# Patient Record
Sex: Male | Born: 1960
Health system: Southern US, Community
[De-identification: ages and names within clinical notes are randomized; demographics above are authoritative.]

## PROBLEM LIST (undated history)

## (undated) DIAGNOSIS — E079 Disorder of thyroid, unspecified: Secondary | ICD-10-CM

## (undated) HISTORY — DX: Disorder of thyroid, unspecified: E07.9

## (undated) HISTORY — PX: COLONOSCOPY: SHX174

---

## 1988-01-20 HISTORY — PX: MENISCUS REPAIR: SHX5179

## 1998-01-19 HISTORY — PX: ARTHROSCOPIC REPAIR ACL: SUR80

## 1998-01-19 HISTORY — PX: WRIST FUSION: SHX839

## 2009-06-25 ENCOUNTER — Emergency Department: Payer: Self-pay | Admitting: Emergency Medicine

## 2010-06-02 ENCOUNTER — Emergency Department: Payer: Self-pay | Admitting: Internal Medicine

## 2010-06-20 ENCOUNTER — Encounter: Payer: Self-pay | Admitting: Family Medicine

## 2011-11-05 ENCOUNTER — Emergency Department: Payer: Self-pay | Admitting: Emergency Medicine

## 2011-11-20 ENCOUNTER — Ambulatory Visit: Payer: Self-pay | Admitting: Family Medicine

## 2011-12-10 ENCOUNTER — Ambulatory Visit: Payer: Self-pay | Admitting: Otolaryngology

## 2012-01-20 HISTORY — PX: THUMB ARTHROSCOPY: SHX2509

## 2013-01-19 HISTORY — PX: FINGER AMPUTATION: SHX636

## 2013-06-03 ENCOUNTER — Emergency Department: Payer: Self-pay | Admitting: Emergency Medicine

## 2013-06-03 LAB — CBC WITH DIFFERENTIAL/PLATELET
BASOS ABS: 0.1 10*3/uL (ref 0.0–0.1)
Basophil %: 0.8 %
EOS PCT: 4.4 %
Eosinophil #: 0.3 10*3/uL (ref 0.0–0.7)
HCT: 44.2 % (ref 40.0–52.0)
HGB: 14.6 g/dL (ref 13.0–18.0)
LYMPHS PCT: 31.4 %
Lymphocyte #: 2.4 10*3/uL (ref 1.0–3.6)
MCH: 27.1 pg (ref 26.0–34.0)
MCHC: 33 g/dL (ref 32.0–36.0)
MCV: 82 fL (ref 80–100)
MONO ABS: 0.7 x10 3/mm (ref 0.2–1.0)
Monocyte %: 9.2 %
Neutrophil #: 4.1 10*3/uL (ref 1.4–6.5)
Neutrophil %: 54.2 %
PLATELETS: 223 10*3/uL (ref 150–440)
RBC: 5.39 10*6/uL (ref 4.40–5.90)
RDW: 14.5 % (ref 11.5–14.5)
WBC: 7.6 10*3/uL (ref 3.8–10.6)

## 2013-06-03 LAB — COMPREHENSIVE METABOLIC PANEL
ALT: 35 U/L (ref 12–78)
Albumin: 3.9 g/dL (ref 3.4–5.0)
Alkaline Phosphatase: 82 U/L
Anion Gap: 5 — ABNORMAL LOW (ref 7–16)
BILIRUBIN TOTAL: 0.6 mg/dL (ref 0.2–1.0)
BUN: 19 mg/dL — ABNORMAL HIGH (ref 7–18)
CREATININE: 1.5 mg/dL — AB (ref 0.60–1.30)
Calcium, Total: 8.8 mg/dL (ref 8.5–10.1)
Chloride: 108 mmol/L — ABNORMAL HIGH (ref 98–107)
Co2: 27 mmol/L (ref 21–32)
EGFR (Non-African Amer.): 52 — ABNORMAL LOW
Glucose: 101 mg/dL — ABNORMAL HIGH (ref 65–99)
Osmolality: 282 (ref 275–301)
POTASSIUM: 3.9 mmol/L (ref 3.5–5.1)
SGOT(AST): 40 U/L — ABNORMAL HIGH (ref 15–37)
Sodium: 140 mmol/L (ref 136–145)
Total Protein: 8.1 g/dL (ref 6.4–8.2)

## 2013-06-06 ENCOUNTER — Ambulatory Visit: Payer: Self-pay | Admitting: Orthopedic Surgery

## 2013-07-16 IMAGING — US ULTRASOUND CORE BIOPSY
1 series · 14 of 25 positions shown · non-contrast
Comparison: none

REASON FOR EXAM: rt thyroid nodule
COMMENTS:

[Series 1: ultrasound core biopsy · 0.08mm/px · 14 of 40 slices shown]
[im 1/40]
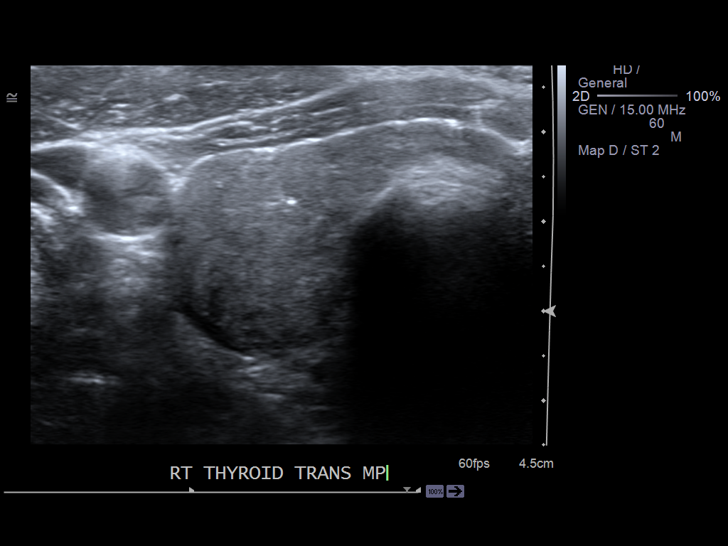
[im 4/40]
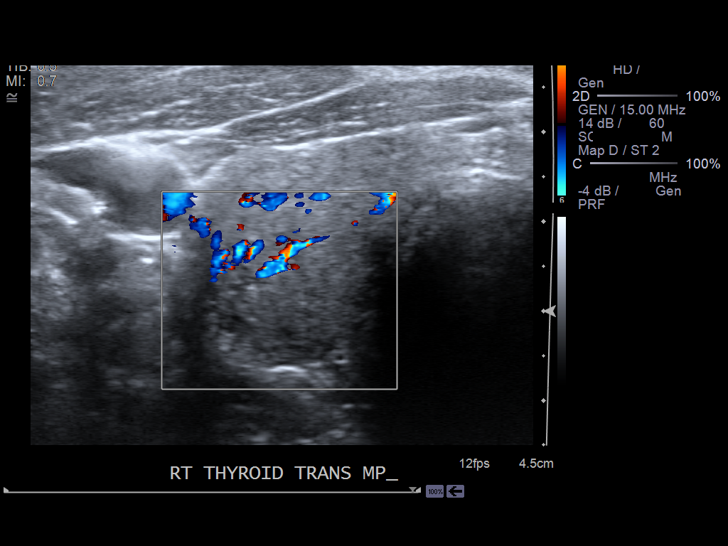
[im 7/40]
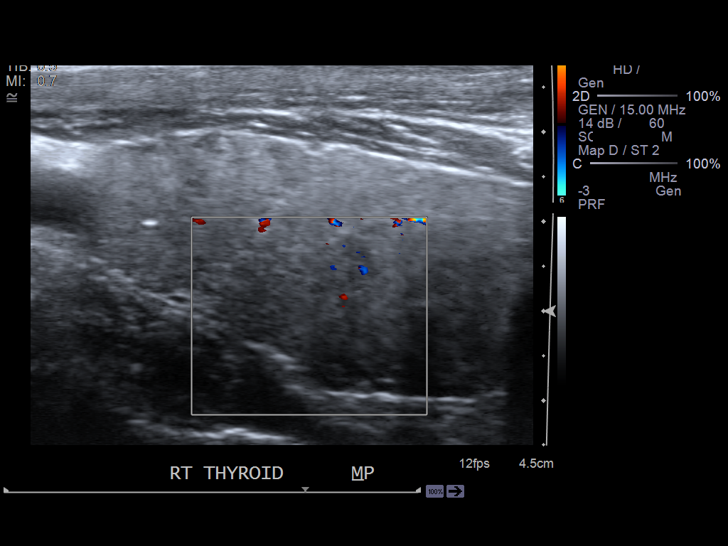
[im 10/40]
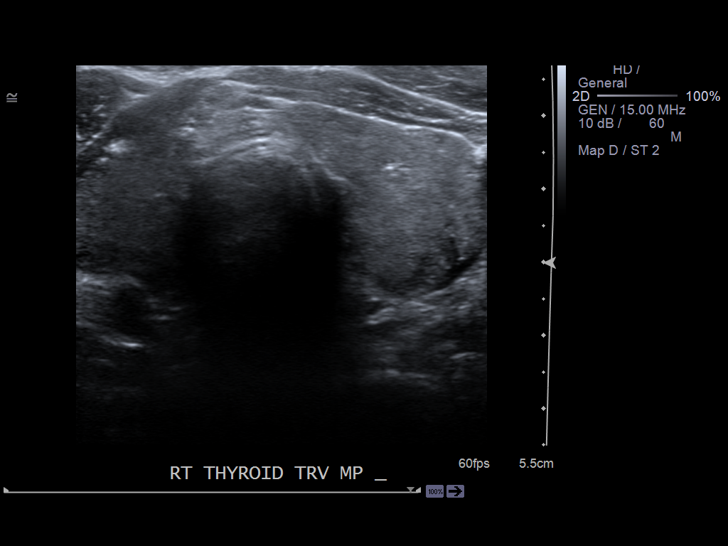
[im 14/40]
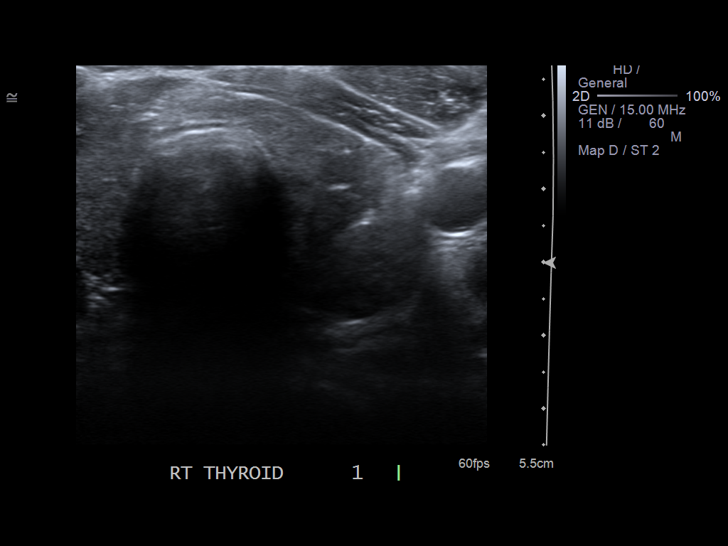
[im 15/40]
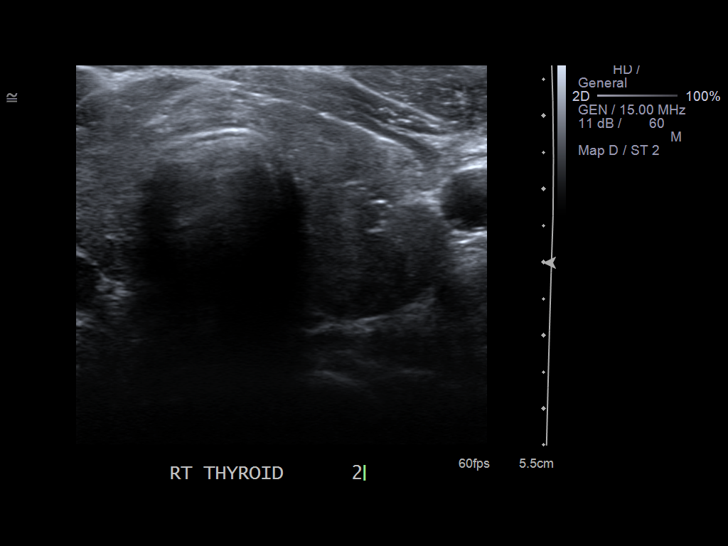
[im 18/40]
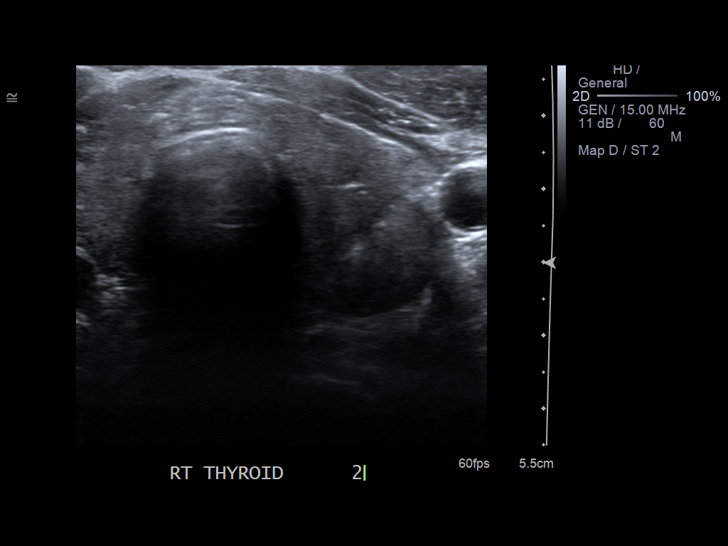
[im 22/40]
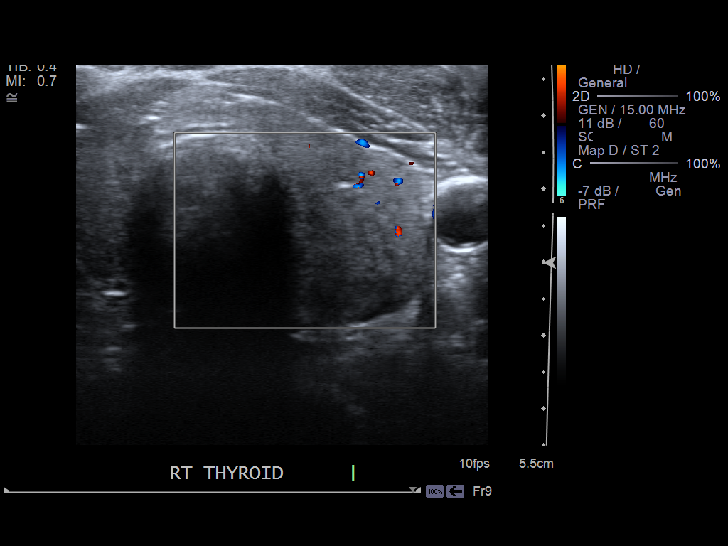
[im 25/40]
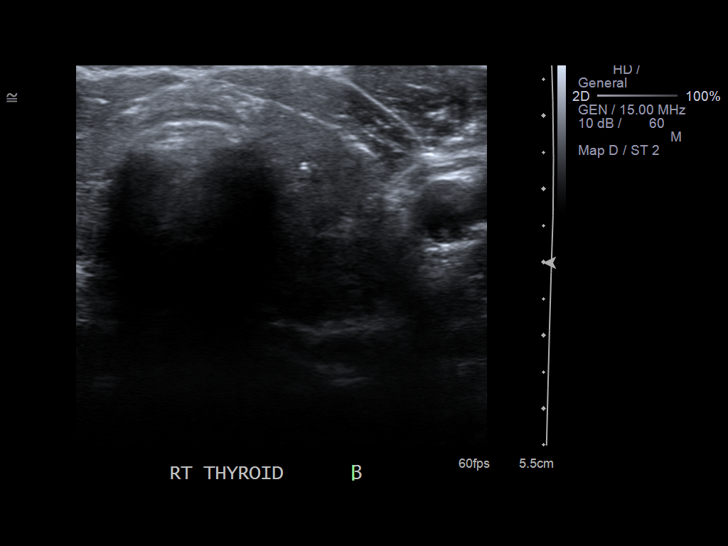
[im 27/40]
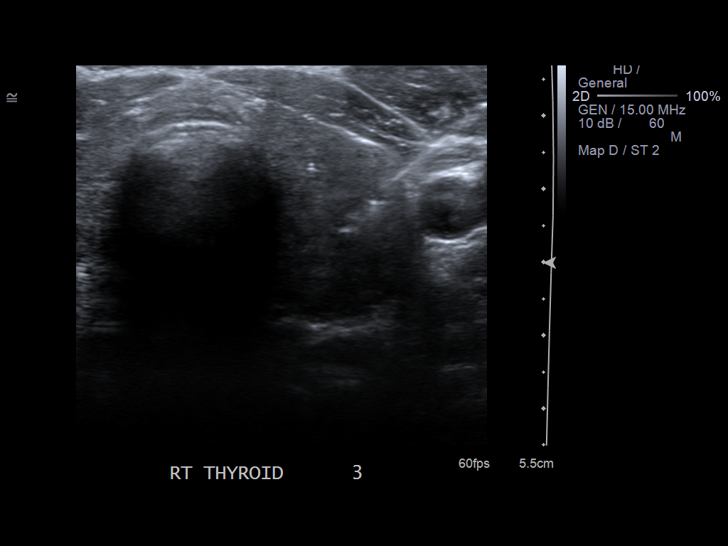
[im 30/40]
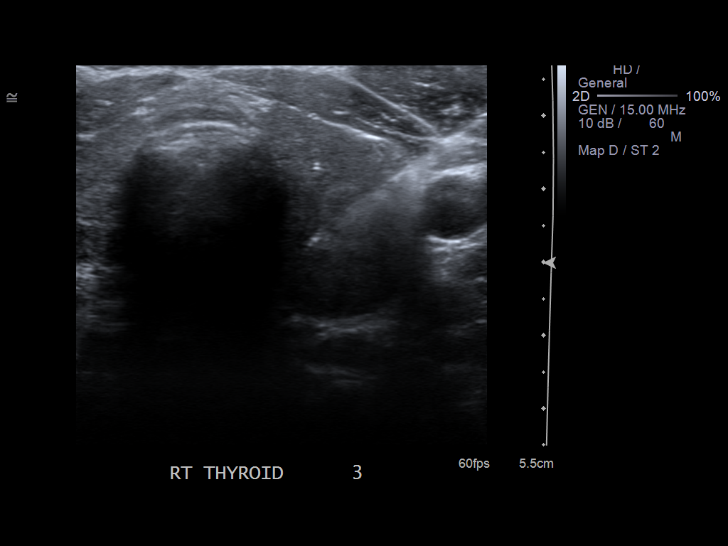
[im 33/40]
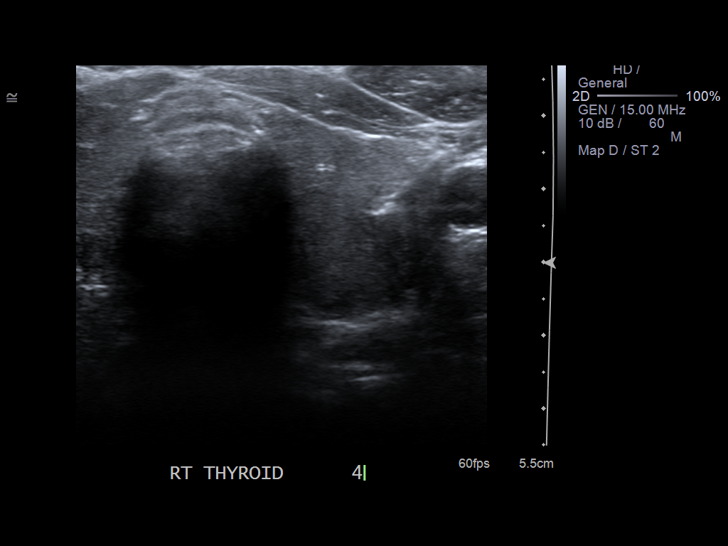
[im 36/40]
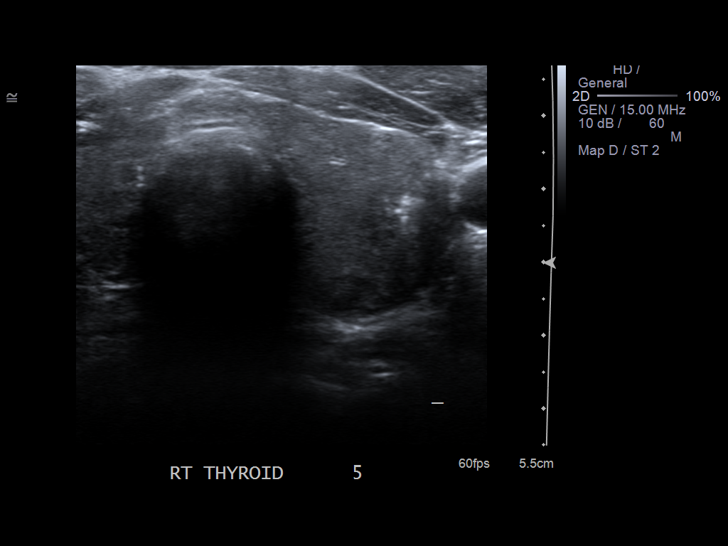
[im 40/40]
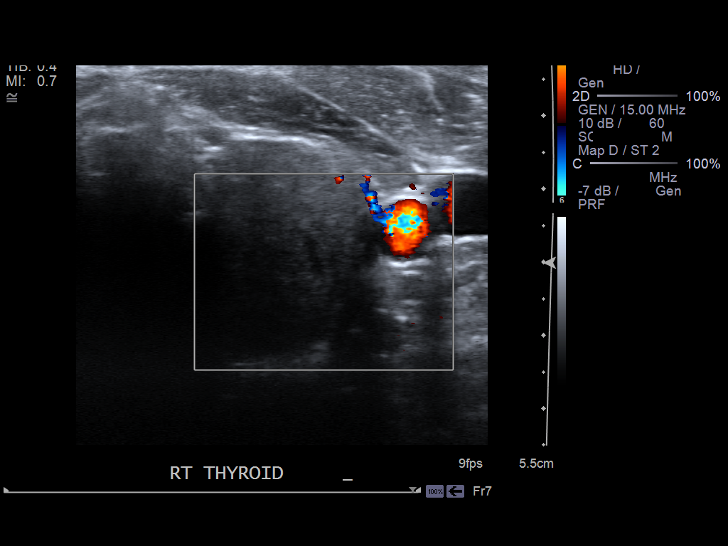

[14 of 25 positions shown; findings below may reference images not displayed]

PROCEDURE:     US  - US GUIDED BX/ASPIRATION NOT BR  - December 10, 2011  [DATE]

RESULT:

Procedure: The patient was informed of the risks and benefits of the
procedure and proper informed consent was obtained. The patient was brought
to the Ultrasound Suite and placed in a supine position. The thyroid was
evaluated and a proper entry site for a nodular mass within the posterior
aspect of the right lobe of the thyroid was established. The overlying soft
tissues were then prepped and draped in the usual sterile fashion. Utilizing
6 mL of 1% lidocaine without epinephrine the overlying soft tissues were
anesthetized. Five passes were made into the thyroid nodule with a 23-gauge
heparinized needle. Seldinger technique was used to remove samples. The
patient tolerated the procedure without complications. Preliminary
evaluation demonstrated appropriate specimens.

Status post biopsy there is no evidence of perilesional free fluid or active
hemorrhage.
IMPRESSION: Ultrasound-guided thyroid biopsy as described above.

## 2013-09-07 ENCOUNTER — Ambulatory Visit: Payer: Self-pay | Admitting: Orthopedic Surgery

## 2013-10-18 ENCOUNTER — Encounter: Payer: Self-pay | Admitting: Orthopedic Surgery

## 2013-10-19 ENCOUNTER — Encounter: Payer: Self-pay | Admitting: Orthopedic Surgery

## 2013-11-19 ENCOUNTER — Encounter: Payer: Self-pay | Admitting: Orthopedic Surgery

## 2013-12-16 ENCOUNTER — Encounter (HOSPITAL_BASED_OUTPATIENT_CLINIC_OR_DEPARTMENT_OTHER): Payer: Self-pay | Admitting: *Deleted

## 2013-12-16 ENCOUNTER — Emergency Department (HOSPITAL_BASED_OUTPATIENT_CLINIC_OR_DEPARTMENT_OTHER)
Admission: EM | Admit: 2013-12-16 | Discharge: 2013-12-16 | Disposition: A | Discharge status: Home / Self Care | Payer: Self-pay | Attending: Emergency Medicine | Admitting: Emergency Medicine

## 2013-12-16 DIAGNOSIS — J020 Streptococcal pharyngitis: Secondary | ICD-10-CM | POA: Insufficient documentation

## 2013-12-16 LAB — RAPID STREP SCREEN (MED CTR MEBANE ONLY): STREPTOCOCCUS, GROUP A SCREEN (DIRECT): POSITIVE — AB

## 2013-12-16 MED ORDER — PENICILLIN G BENZATHINE 1200000 UNIT/2ML IM SUSP
1.2000 10*6.[IU] | Freq: Once | INTRAMUSCULAR | Status: AC
  Administered 2013-12-16: 1.2 10*6.[IU] via INTRAMUSCULAR
  Filled 2013-12-16: qty 2

## 2013-12-16 NOTE — ED Provider Notes (Signed)
CSN: [id number]     Arrival date & time 12/16/13  1309 History   First MD Initiated Contact with Patient 12/16/13 1614     Chief Complaint  Patient presents with  . Fever   (Consider location/radiation/quality/duration/timing/severity/associated sxs/prior Treatment) HPI  Carlos Mccarty. is a 53 yo male presenting with chills, fever of 102, and sore throat that started today.  He states he woke up this am and felt generally achy and his throat hurt.  He has taken ibuprofen for the temperature which has also helped his throat.  He denies cough, rash, nasal congestion, nausea, vomiting or abd pain    History reviewed. No pertinent past medical history. Past Surgical History  Procedure Laterality Date  . Finger amputation     No family history on file. History  Substance Use Topics  . Smoking status: Never Smoker   . Smokeless tobacco: Not on file  . Alcohol Use: No    Review of Systems  Constitutional: Positive for fever and chills.  HENT: Positive for sore throat.   Eyes: Negative for visual disturbance.  Respiratory: Negative for shortness of breath.   Cardiovascular: Negative for chest pain.  Gastrointestinal: Negative for nausea and vomiting.  Musculoskeletal: Negative for myalgias.  Skin: Negative for rash.  Neurological: Negative for headaches.   Allergies  Review of patient's allergies indicates no known allergies.  Home Medications   Prior to Admission medications   Not on File   BP 129/70 mmHg  Pulse 85  Temp(Src) 99 F (37.2 C) (Oral)  Resp 16  Ht 6\' 5"  (1.956 m)  Wt 275 lb (124.739 kg)  BMI 32.60 kg/m2  SpO2 98% Physical Exam  Constitutional: He appears well-developed and well-nourished. No distress.  HENT:  Head: Normocephalic and atraumatic.  Mouth/Throat: Uvula is midline. No trismus in the jaw. No uvula swelling. Posterior oropharyngeal erythema present. No oropharyngeal exudate, posterior oropharyngeal edema or tonsillar abscesses.  Eyes:  Conjunctivae are normal.  Neck: Neck supple. No thyromegaly present.  Cardiovascular: Normal rate, regular rhythm and intact distal pulses.   Pulmonary/Chest: Effort normal and breath sounds normal. No respiratory distress. He has no wheezes. He has no rales. He exhibits no tenderness.  Abdominal: Soft. There is no tenderness.  Musculoskeletal: He exhibits no tenderness.  Lymphadenopathy:    He has cervical adenopathy.  Neurological: He is alert.  Skin: Skin is warm and dry. No rash noted. He is not diaphoretic.  Psychiatric: He has a normal mood and affect.  Nursing note and vitals reviewed.   ED Course  Procedures (including critical care time) Labs Review Labs Reviewed  RAPID STREP SCREEN - Abnormal; Notable for the following:    Streptococcus, Group A Screen (Direct) POSITIVE (*)    All other components within normal limits    Imaging Review No results found.   EKG Interpretation None      MDM   Final diagnoses:  Strep pharyngitis   53 yo male with fever, cervical lymphadenopathy, & sore throat.Positive strep screen. Treated in the ED with PCN IM.  Discussed importance of water rehydration. Presentation non concerning for PTA or infxn spread to soft tissue. No trismus or uvula deviation. Specific return precautions discussed. Pt able to drink water in ED without difficulty with intact air way. Pt is well-appearing, in no acute distress and vital signs are stable.  They appear safe to be discharged.  Discharge include follow-up with their PCP.  Return precautions provided.  Pt aware of plan and in agreement.  Filed Vitals:   12/16/13 1327 12/16/13 1605  BP: 118/74 129/70  Pulse: 51 85  Temp: 100 F (37.8 C) 99 F (37.2 C)  TempSrc: Oral Oral  Resp: 16 16  Height: 6\' 5"  (1.956 m)   Weight: 275 lb (124.739 kg)   SpO2: 99% 98%   Meds given in ED:  Medications  penicillin g benzathine (BICILLIN LA) 1200000 UNIT/2ML injection 1.2 Million Units (1.2 Million Units  Intramuscular Given 12/16/13 1725)    There are no discharge medications for this patient.     Britt Bottom, NP 12/19/13 [place], MD 12/19/13 [phone]

## 2013-12-16 NOTE — Discharge Instructions (Signed)
Please follow the directions provided.  Be sure to establish care with a primary care provider to follow-up to ensure you are getting better.  The antibiotic shot you were given is all you will need to treat this infection.  You may use tylenol or ibuprofen for continued fever or muscle aches.  Don't hesitate to return for new, worsening or concerning symptoms.     SEEK IMMEDIATE MEDICAL CARE IF:  You develop any new symptoms such as vomiting, severe headache, stiff or painful neck, chest pain, shortness of breath, or trouble swallowing.  You develop severe throat pain, drooling, or changes in your voice.  You develop swelling of the neck, or the skin on the neck becomes red and tender.  You develop signs of dehydration, such as fatigue, dry mouth, and decreased urination.  You become increasingly sleepy, or you cannot wake up completely.     Emergency Department Resource Guide 1) Find a Doctor and Pay Out of Pocket Although you won't have to find out who is covered by your insurance plan, it is a good idea to ask around and get recommendations. You will then need to call the office and see if the doctor you have chosen will accept you as a new patient and what types of options they offer for patients who are self-pay. Some doctors offer discounts or will set up payment plans for their patients who do not have insurance, but you will need to ask so you aren't surprised when you get to your appointment.  2) Contact Your Local Health Department Not all health departments have doctors that can see patients for sick visits, but many do, so it is worth a call to see if yours does. If you don't know where your local health department is, you can check in your phone book. The CDC also has a tool to help you locate your state's health department, and many state websites also have listings of all of their local health departments.  3) Find a [facility] If your illness is not likely to be very severe  or complicated, you may want to try a walk in clinic. These are popping up all over the country in pharmacies, drugstores, and shopping centers. They're usually staffed by nurse practitioners or physician assistants that have been trained to treat common illnesses and complaints. They're usually fairly quick and inexpensive. However, if you have serious medical issues or chronic medical problems, these are probably not your best option.  No Primary Care Doctor: - Call Health Connect at  [phone] - they can help you locate a primary care doctor that  accepts your insurance, provides certain services, etc. - Physician Referral Service- [phone]  Chronic Pain Problems: Organization         Address  Phone   Notes  [facility]  [phone] Patients need to be referred by their primary care doctor.   Medication Assistance: Organization         Address  Phone   Notes  [facility] [address] [place], [place] [address] [phone] --Must be a resident of [facility] -- Must have NO insurance coverage whatsoever (no Medicaid/ Medicare, etc.) -- The pt. MUST have a primary care doctor that directs their care regularly and follows them in the community   Med[facility]ist  [phone]   [employer]  [phone]    Agencies that provide inexpensive medical care: Organization  Address  Phone   Notes  [place]  [phone]   [facility]    [phone]   [facility] [place], [place] [address] [phone]   [place] [address] [place], [place] [phone]   Planned Parenthood    [phone]   [facility]    [phone]   [place] and [place] [address], [place] Phone:  [phone], Fax:  [phone] Hours of Operation:  9 am - 6 pm, M-F.  Also accepts  Medicaid/Medicare and self-pay.  [facility] for [place], [address], [place] Phone: [phone], Fax: [phone]. Hours of Operation:  8:30 am - 5:30 pm, M-F.  Also accepts Medicaid and self-pay.  [facility] [place] [address] [place] Phone: [phone]   [place], [place], [place] [phone], Ext. [phone] Mondays & Thursdays: 7-9 AM.  First 15 patients are seen on a first come, first serve basis.    [place] Providers:  Organization         Address  Phone   Notes  [facility] [address] [place] [address], [place] [phone] Also accepts self-pay patients.  [facility] [place] [address], [place]  [phone]   [place], [address], [place] [phone]   [facility] [address] [place] [phone]   Lucianne Lei [address], [place]   [phone] Only accepts [place] Access [place] patients after they have their name applied to their card.   Self-Pay (no insurance) in [facility]:  Organization         Address  Phone   Notes  Sickle Cell Patients, [facility] [place] [phone]   [facility] [place] [phone]   [facility] [place]  [place], [place],  [phone]   [facility]/Dr. Osei-Bonsu  [address] [place] or [address], [place] [phone] Phone number for both [place] and [place] locations is the same.  Urgent Medical and [facility] [address], [place] [phone]   [facility] [address] [place] or [address] [phone]   [facility] [address], [place] [phone], phone; [phone], fax Sees patients 1st and 3rd Saturday of every month.  Must not qualify for public or private insurance (i.e. Medicaid, Medicare, [place] Health Choice, Veterans' Benefits)  Household income should be no more than 200% of the poverty level The clinic cannot treat you if you are pregnant or think you are pregnant  Sexually transmitted diseases are not treated at the clinic.    Dental Care: Organization         Address  Phone  Notes  [facility] Department of [facility] [place] [phone] Accepts children up to age 83 who are enrolled in [place] or [place]; pregnant women with a Medicaid card; and children who have applied for Medicaid or [place] Health Choice, but were declined, whose parents can pay a reduced fee at time  of service.  [facility] Department of [employer] [facility]  [address], [place] [phone] Accepts children up to age 34 who are enrolled in [place] or [place]; pregnant women with a Medicaid card; and children who have applied for Medicaid or [place] Health Choice, but were declined, whose parents can pay a reduced fee at time of service.  [place] Adult Dental Access PROGRAM  [place] [phone] Patients are seen by appointment only. Walk-ins are not accepted. [place] will see patients 47 years of age and older. Monday - Tuesday (8am-5pm) Most Wednesdays (8:30-5pm) $30 per visit, cash only  [facility] Adult Dental Access PROGRAM  [address], [facility] [phone] Patients are seen by appointment only. Walk-ins are not accepted. [place] will see patients 77 years of age and older. One Wednesday Evening (Monthly: Volunteer Based).  $30 per visit, cash only  [place]  [phone] for adults; Children under age 30, call Graduate Pediatric Dentistry at [phone]. Children aged  3-14, please call [phone] to request a pediatric application.  Dental services are provided in all areas of dental care including fillings, crowns and bridges, complete and partial dentures, implants, gum treatment, root canals, and extractions. Preventive care is also provided. Treatment is provided to both adults and children. Patients are selected via a lottery and there is often a waiting list.   [facility] [address], [place]  [phone] [email or web address]   [facility] [address] [place], [place] [phone], Ext. [phone] Second and Fourth Thursday of each month, opens at 6:30 AM; Clinic ends at 9 AM.  Patients are seen on a first-come first-served basis, and a limited number are seen during each clinic.   [facility]  [address] Hillard Danker [place], [place] [phone]   Eligibility Requirements You must have lived in [place], [place], or [place] counties for at least the last three months.   You cannot be eligible for state or federal sponsored [employer], including [employer], [place], or [employer].   You generally cannot be eligible for healthcare insurance through your employer.    How to apply: Eligibility screenings are held every Tuesday and Wednesday afternoon from 1:00 pm until 4:00 pm. You do not need an appointment for the interview!  [facility] [address], [place], [place]   [place]  [place] Department  [place]  [phone]    Behavioral Health Resources in the Community: Intensive Outpatient Programs Organization         Address  Phone  Notes  [place]. [address], [place], [place] [phone]   [facility] Outpatient [address] [place], [place]   [facility]: [facility] [address]  [place], [place]   [place] [address]  [place], [place] or [phone]   Substance Abuse Resources Organization         Address  Phone  Notes  Al[facility] Drug Services  [phone]   [place]  [phone]   [facility] [place]   Chinita Pester  [phone]   Residential & Outpatient Substance Abuse Program  [phone]   Psychological Services Organization         Address  Phone  Notes  [facility] [place]  [place]  [phone]   [place] [address] [place] or [phone]    Mobile Crisis Teams Organization         Address  Phone  Notes  [facility], Mobile Crisis Care Unit  [phone]   [facility]  [address] [place], [place]   [place] Levels [address] [place] [phone]    Self-Help/Support Groups Organization         Address  Phone             Notes  [place]. of [place] - variety of support groups  [place] Call for more information  Narcotics Anonymous (NA), [facility] [address], [employer] [place]  2 meetings at this location   Special educational needs teacher         Address  Phone  Notes  [facility] [place],    [place]  [phone]   [facility]  [address], [place] [address], [place], [place]   [place], [place] [phone] Admissions: 8am-3pm M-F  Incentives Substance [place] [address],    [place], [place] [phone]   [facility] [address] [place], [place], [place]   [facility] [address],  [place], [place]   [facility] - Intensive Outpatient [address], [place], [place]   [facility]  ([place].) [address] [place].,  [place], [place] [phone] or [phone]   [facility] Services (RTS) [address] [place], [place] Accepts Medicaid  [facility] [place] [address]  [place] [phone] Substance Abuse/Addiction Treatment   [facility] Organization         Address  Phone  Notes  [facility]  [phone]   Domenic Schwab, PhD [address] [place], [place]   [phone] or [phone]   [place], [place] [phone]   [facility] [address] [place], [place] [phone] Insurance/Medicaid/sponsorship through [facility] and Families [address], [address] [place]                                    [place], [place] [phone] [place] [address], [place] [phone]    Dr. Adele Schilder  [phone]   [facility] [place] Dept. 1) [address] [place] 2) [place] 3)  [place] [address], [place] [phone]  [phone]   [place] [phone] or [phone] (After Hours)

## 2013-12-16 NOTE — ED Notes (Signed)
Patient states that he has a fever, sore throat

## 2013-12-19 ENCOUNTER — Encounter: Payer: Self-pay | Admitting: Orthopedic Surgery

## 2014-05-12 NOTE — Op Note (Signed)
PATIENT NAME:  Carlos Mccarty, Carlos Mccarty MR#:  [id number] DATE OF BIRTH:  1960-11-22  DATE OF PROCEDURE:  09/07/2013  PREOPERATIVE DIAGNOSIS: Residual left middle finger nail post amputation.  POSTOPERATIVE DIAGNOSIS:  Residual left middle finger nail post amputation.   PROCEDURE: Excision, nail and ablation of nail matrix.   ANESTHESIA: General.   SURGEON: Hessie Knows, M.D.   DESCRIPTION OF PROCEDURE: The patient was brought to the Operating Room and after adequate anesthesia was obtained, a digital block was also placed after prepping and draping, and appropriate patient identification and timeout procedure completed.  Sensorcaine 0.5% without epinephrine 10 mL was infiltrated on either side of the base of the middle finger to give postoperative analgesia.  The nail was quite thin.  Skin incisions were made on the dorsum of the finger directed to the sides to make a flap.  The bed of the nail was exposed. The nail, itself, was less than a millimeter thick. This was removed.  Needle tip cautery was used to ablate the tissue  of the matrix where the nail seemed to be arising from, trying to ablate all of this. The wound was then dressed with Xeroform, 4 x 4's and a 1 inch Kling. The patient tolerated the procedure well.   ESTIMATED BLOOD LOSS: Minimal.   COMPLICATIONS: None.   SPECIMEN: None.    ____________________________ Laurene Footman, MD mjm:TT D: 09/07/2013 17:57:12 ET T: 09/07/2013 18:32:05 ET JOB#: [id number]  cc: Laurene Footman, MD, <Dictator> Laurene Footman MD ELECTRONICALLY SIGNED 09/08/2013 7:15

## 2014-05-12 NOTE — Op Note (Signed)
PATIENT NAME:  Carlos Mccarty, Carlos Mccarty MR#:  [id number] DATE OF BIRTH:  25-Dec-1960  DATE OF PROCEDURE:  06/06/2013  PREOPERATIVE DIAGNOSIS: Open left middle fingertip amputation.   POSTOPERATIVE DIAGNOSIS:  Open left middle fingertip amputation.  PROCEDURE:  Revision amputation, left middle fingertip.   ANESTHESIA:  General.   SURGEON:  Hessie Knows, M.D.   DESCRIPTION OF PROCEDURE:  The patient was brought to the operating room and after adequate general anesthesia was obtained, the left arm was prepped and draped in the usual sterile fashion. A tourniquet was applied but not required at the upper arm. After patient identification and timeout procedures were completed, a digital block was placed for postoperative analgesia with 20 mL of 0.5% Sensorcaine without epinephrine. Next, a Soil scientist was used to elevate the soft tissue over the distal phalanx. A portion of the tuft had been amputated and several millimeters of bone were prominent. This bone was debrided with the use of a rongeur. There were loose skin edges which were devitalized. These were removed with the use of a scalpel. The germinal matrix was then exposed and phenyl applied, and then rinsed off with alcohol to try to prevent nail regrowth. The wound was then thoroughly irrigated. The volar and dorsal flaps covered, gave fairly good coverage over the bone, and were left in place. The wound was then dressed with Adaptic, 2 x 2's and a 2 inch Kling. The patient was then sent to the recovery room in stable condition. Penrose drain was used for about 3 minutes during the procedure to minimize bleeding, as the debridement was carried out. There was no specimen. No complications.    ESTIMATED BLOOD LOSS:   Minimal.   CONDITION:  To recovery room stable.    ____________________________ Laurene Footman, MD mjm:dmm D: 06/06/2013 22:00:40 ET T: 06/06/2013 22:20:54 ET JOB#: [id number]  cc: Laurene Footman, MD, <Dictator> Laurene Footman  MD ELECTRONICALLY SIGNED 06/07/2013 10:11

## 2015-04-09 ENCOUNTER — Encounter: Payer: Self-pay | Admitting: *Deleted

## 2015-04-09 ENCOUNTER — Other Ambulatory Visit: Payer: Self-pay

## 2015-04-10 ENCOUNTER — Encounter: Payer: Self-pay | Admitting: *Deleted

## 2015-04-10 NOTE — Patient Instructions (Signed)
  Your procedure is scheduled on: 04-16-15 Report to [place] To find out your arrival time please call [phone] between 1PM - 3PM on 04-15-15  Remember: Instructions that are not followed completely may result in serious medical risk, up to and including death, or upon the discretion of your surgeon and anesthesiologist your surgery may need to be rescheduled.    _X___ 1. Do not eat food or drink liquids after midnight. No gum chewing or hard candies.     _X___ 2. No Alcohol for 24 hours before or after surgery.   ____ 3. Bring all medications with you on the day of surgery if instructed.    ____ 4. Notify your doctor if there is any change in your medical condition     (cold, fever, infections).     Do not wear jewelry, make-up, hairpins, clips or nail polish.  Do not wear lotions, powders, or perfumes. You may wear deodorant.  Do not shave 48 hours prior to surgery. Men may shave face and neck.  Do not bring valuables to the hospital.    [facility] is not responsible for any belongings or valuables.               Contacts, dentures or bridgework may not be worn into surgery.  Leave your suitcase in the car. After surgery it may be brought to your room.  For patients admitted to the hospital, discharge time is determined by your treatment team.   Patients discharged the day of surgery will not be allowed to drive home.   Please read over the following fact sheets that you were given:      ____ Take these medicines the morning of surgery with A SIP OF WATER:    1. NONE  2.   3.   4.  5.  6.  ____ Fleet Enema (as directed)   ____ Use CHG Soap as directed  ____ Use inhalers on the day of surgery  ____ Stop metformin 2 days prior to surgery    ____ Take 1/2 of usual insulin dose the night before surgery and none on the morning of surgery.   ____ Stop Coumadin/Plavix/aspirin-N/A  ____ Stop Anti-inflammatories-NO NSAIDS OR ASA  PRODUCTS-TYLENOL OK TO TAKE   ____ Stop supplements until after surgery.    ____ Bring C-Pap to the hospital.

## 2015-04-15 MED ORDER — PHENYLEPHRINE HCL 10 % OP SOLN
OPHTHALMIC | Status: AC
  Filled 2015-04-15: qty 5

## 2015-04-15 MED ORDER — MOXIFLOXACIN HCL 0.5 % OP SOLN
OPHTHALMIC | Status: AC
  Filled 2015-04-15: qty 3

## 2015-04-15 MED ORDER — CYCLOPENTOLATE HCL 2 % OP SOLN
OPHTHALMIC | Status: AC
Start: 2015-04-15 — End: 2015-04-15
  Filled 2015-04-15: qty 2

## 2015-04-16 ENCOUNTER — Ambulatory Visit: Payer: Worker's Compensation | Admitting: Anesthesiology

## 2015-04-16 ENCOUNTER — Ambulatory Visit
Admission: RE | Admit: 2015-04-16 | Discharge: 2015-04-16 | Disposition: A | Discharge status: Home / Self Care | Payer: Worker's Compensation | Source: Ambulatory Visit | Attending: Orthopedic Surgery | Admitting: Orthopedic Surgery

## 2015-04-16 ENCOUNTER — Encounter
Admission: RE | Disposition: A | Discharge status: Home / Self Care | Payer: Self-pay | Source: Ambulatory Visit | Attending: Orthopedic Surgery

## 2015-04-16 ENCOUNTER — Encounter: Payer: Self-pay | Admitting: *Deleted

## 2015-04-16 DIAGNOSIS — Z89022 Acquired absence of left finger(s): Secondary | ICD-10-CM | POA: Insufficient documentation

## 2015-04-16 DIAGNOSIS — L608 Other nail disorders: Secondary | ICD-10-CM | POA: Diagnosis present

## 2015-04-16 DIAGNOSIS — Z820 Family history of epilepsy and other diseases of the nervous system: Secondary | ICD-10-CM | POA: Diagnosis not present

## 2015-04-16 DIAGNOSIS — Z6831 Body mass index (BMI) 31.0-31.9, adult: Secondary | ICD-10-CM | POA: Diagnosis not present

## 2015-04-16 DIAGNOSIS — Z8249 Family history of ischemic heart disease and other diseases of the circulatory system: Secondary | ICD-10-CM | POA: Diagnosis not present

## 2015-04-16 HISTORY — PX: NAILBED REPAIR: SHX5028

## 2015-04-16 SURGERY — REPAIR, NAIL BED
Anesthesia: General | Laterality: Left

## 2015-04-16 MED ORDER — BUPIVACAINE HCL (PF) 0.5 % IJ SOLN
INTRAMUSCULAR | Status: DC | PRN
Start: 2015-04-16 — End: 2015-04-16
  Administered 2015-04-16: 20 mL

## 2015-04-16 MED ORDER — ONDANSETRON HCL 4 MG/2ML IJ SOLN
INTRAMUSCULAR | Status: DC | PRN
  Administered 2015-04-16: 4 mg via INTRAVENOUS

## 2015-04-16 MED ORDER — FAMOTIDINE 20 MG PO TABS
ORAL_TABLET | ORAL | Status: AC
  Administered 2015-04-16: 20 mg via ORAL
  Filled 2015-04-16: qty 1

## 2015-04-16 MED ORDER — MIDAZOLAM HCL 2 MG/2ML IJ SOLN
INTRAMUSCULAR | Status: DC | PRN
  Administered 2015-04-16: 2 mg via INTRAVENOUS

## 2015-04-16 MED ORDER — PROPOFOL 10 MG/ML IV BOLUS
INTRAVENOUS | Status: DC | PRN
  Administered 2015-04-16: 50 mg via INTRAVENOUS
  Administered 2015-04-16: 200 mg via INTRAVENOUS

## 2015-04-16 MED ORDER — GLYCOPYRROLATE 0.2 MG/ML IJ SOLN
INTRAMUSCULAR | Status: DC | PRN
  Administered 2015-04-16: 0.2 mg via INTRAVENOUS

## 2015-04-16 MED ORDER — FENTANYL CITRATE (PF) 100 MCG/2ML IJ SOLN
INTRAMUSCULAR | Status: DC | PRN
  Administered 2015-04-16 (×2): 50 ug via INTRAVENOUS

## 2015-04-16 MED ORDER — LIDOCAINE HCL (PF) 1 % IJ SOLN
INTRAMUSCULAR | Status: AC
  Filled 2015-04-16: qty 30

## 2015-04-16 MED ORDER — ONDANSETRON HCL 4 MG/2ML IJ SOLN: 4.0000 mg | Freq: Once | INTRAMUSCULAR | Status: DC | PRN

## 2015-04-16 MED ORDER — DEXAMETHASONE SODIUM PHOSPHATE 10 MG/ML IJ SOLN
INTRAMUSCULAR | Status: DC | PRN
  Administered 2015-04-16: 5 mg via INTRAVENOUS

## 2015-04-16 MED ORDER — LIDOCAINE HCL (CARDIAC) 20 MG/ML IV SOLN
INTRAVENOUS | Status: DC | PRN
  Administered 2015-04-16: 100 mg via INTRAVENOUS

## 2015-04-16 MED ORDER — FENTANYL CITRATE (PF) 100 MCG/2ML IJ SOLN: 25.0000 ug | INTRAMUSCULAR | Status: DC | PRN

## 2015-04-16 MED ORDER — HYDROCODONE-ACETAMINOPHEN 5-325 MG PO TABS: 1.0000 | ORAL_TABLET | Freq: Four times a day (QID) | ORAL | Status: DC | PRN

## 2015-04-16 MED ORDER — LACTATED RINGERS IV SOLN
INTRAVENOUS | Status: DC
  Administered 2015-04-16: 11:00:00 via INTRAVENOUS

## 2015-04-16 MED ORDER — FAMOTIDINE 20 MG PO TABS
20.0000 mg | ORAL_TABLET | Freq: Once | ORAL | Status: AC
  Administered 2015-04-16: 20 mg via ORAL

## 2015-04-16 MED ORDER — BUPIVACAINE HCL (PF) 0.5 % IJ SOLN
INTRAMUSCULAR | Status: AC
  Filled 2015-04-16: qty 30

## 2015-04-16 SURGICAL SUPPLY — 21 items
BNDG GAUZE 1X2.1 STRL (MISCELLANEOUS) ×3 IMPLANT
CANISTER SUCT 1200ML W/VALVE (MISCELLANEOUS) ×2 IMPLANT
CHLORAPREP W/TINT 26ML (MISCELLANEOUS) ×2 IMPLANT
ELECT CAUTERY BLADE 6.4 (BLADE) ×2 IMPLANT
GAUZE PETRO XEROFOAM 1X8 (MISCELLANEOUS) ×2 IMPLANT
GAUZE SPONGE 4X4 12PLY STRL (GAUZE/BANDAGES/DRESSINGS) ×2 IMPLANT
GAUZE SPONGE NON-WVN 2X2 STRL (MISCELLANEOUS) IMPLANT
GLOVE BIOGEL PI IND STRL 9 (GLOVE) ×1 IMPLANT
GLOVE BIOGEL PI INDICATOR 9 (GLOVE) ×1
GLOVE SURG ORTHO 9.0 STRL STRW (GLOVE) ×2 IMPLANT
GOWN STRL REUS W/ TWL LRG LVL3 (GOWN DISPOSABLE) ×1 IMPLANT
GOWN STRL REUS W/TWL LRG LVL3 (GOWN DISPOSABLE) ×2
GOWN SURG XXL (GOWNS) ×2 IMPLANT
KIT RM TURNOVER STRD PROC AR (KITS) ×2 IMPLANT
NDL HYPO 25X1 1.5 SAFETY (NEEDLE) ×1 IMPLANT
NEEDLE HYPO 25X1 1.5 SAFETY (NEEDLE) ×2 IMPLANT
PACK EXTREMITY ARMC (MISCELLANEOUS) ×2 IMPLANT
SPONGE VERSALON 2X2 STRL (MISCELLANEOUS) ×6
STOCKINETTE 48X4 2 PLY STRL (GAUZE/BANDAGES/DRESSINGS) ×1 IMPLANT
STOCKINETTE STRL 4IN 9604848 (GAUZE/BANDAGES/DRESSINGS) ×2 IMPLANT
SUT ETHILON NAB PS2 4-0 18IN (SUTURE) ×1 IMPLANT

## 2015-04-16 NOTE — Transfer of Care (Signed)
Immediate Anesthesia Transfer of Care Note  Patient: Carlos Mccarty.  Procedure(s) Performed: Procedure(s): nailbed excision left middle finger (Left)  Patient Location: PACU  Anesthesia Type:General  Level of Consciousness: sedated  Airway & Oxygen Therapy: Patient Spontanous Breathing and Patient connected to face mask oxygen  Post-op Assessment: Report given to RN and Post -op Vital signs reviewed and stable  Post vital signs: stable  Last Vitals:  Filed Vitals:   04/16/15 1020 04/16/15 1344  BP: 128/82 133/67  Pulse: 75 71  Temp: 36.8 C   Resp: 16 24    Complications: No apparent anesthesia complications

## 2015-04-16 NOTE — Anesthesia Preprocedure Evaluation (Signed)
Anesthesia Evaluation  Patient identified by MRN, date of birth, ID band Patient awake    Reviewed: Allergy & Precautions, NPO status , Patient's Chart, lab work & pertinent test results  Airway Mallampati: II       Dental  (+) Teeth Intact   Pulmonary neg pulmonary ROS,     + decreased breath sounds      Cardiovascular Exercise Tolerance: Good  Rhythm:Regular Rate:Normal     Neuro/Psych    GI/Hepatic negative GI ROS, Neg liver ROS,   Endo/Other  negative endocrine ROSMorbid obesity  Renal/GU negative Renal ROS  negative genitourinary   Musculoskeletal negative musculoskeletal ROS (+)   Abdominal Normal abdominal exam  (+)   Peds negative pediatric ROS (+)  Hematology   Anesthesia Other Findings   Reproductive/Obstetrics                             Anesthesia Physical Anesthesia Plan  ASA: II  Anesthesia Plan: General   Post-op Pain Management:    Induction: Intravenous  Airway Management Planned: LMA  Additional Equipment:   Intra-op Plan:   Post-operative Plan: Extubation in OR  Informed Consent: I have reviewed the patients History and Physical, chart, labs and discussed the procedure including the risks, benefits and alternatives for the proposed anesthesia with the patient or authorized representative who has indicated his/her understanding and acceptance.     Plan Discussed with: CRNA  Anesthesia Plan Comments:         Anesthesia Quick Evaluation

## 2015-04-16 NOTE — Progress Notes (Signed)
No pain on discharge

## 2015-04-16 NOTE — H&P (Signed)
Reviewed paper H+P, will be scanned into chart. No changes noted.  

## 2015-04-16 NOTE — Op Note (Signed)
04/16/2015  1:43 PM  PATIENT:  Carlos Mccarty.  55 y.o. male  PRE-OPERATIVE DIAGNOSIS:  ACQUIRED DEFORMITY OF NAIL LEFT MIDDLE FINGER  POST-OPERATIVE DIAGNOSIS:  acquired deformity of nail left middle finger  PROCEDURE:  Procedure(s): nailbed excision left middle finger (Left)  SURGEON: Laurene Footman, MD  ASSISTANTS: None  ANESTHESIA:   local and general  EBL:  Total I/O In: 900 [I.V.:900] Out: -   BLOOD ADMINISTERED:none  DRAINS: none   LOCAL MEDICATIONS USED:  MARCAINE     SPECIMEN:  No Specimen  DISPOSITION OF SPECIMEN:  N/A  COUNTS:  YES  TOURNIQUET:  * No tourniquets in log * 9 minutes at 250 mmHg  IMPLANTS: None  DICTATION: .Dragon Dictation patient brought the operating room and after adequate general anesthesia was obtained, left arm was prepped and draped in the sterile fashion. After patient identification and timeout procedures were completed, tourniquet was raised. Incisions were made to the dorsal radial and ulnar side of the finger to make a flap and pull back the dorsal skin to allow for exposure of the germinal matrix the prior nail was removed and the deep tissue was excised with a scalpel to remove the germinal matrix tissue and  dorsal fold at the base of the skin incision. This appeared to excise all germinal tissue.   The wound was thoroughly irrigated and a digital block with a total of 20 cc half percent Sensorcaine infiltrated at the base the finger to minimize postop analgesia. The skin incisions were then repaired using 4-0 nylon in a simple interrupted fashion trying to get coverage of the tip of the fingertip. Dressing of Xeroform 2 x 2's and a finger roll were applied patient center comes stable condition  PLAN OF CARE: Discharge to home after PACU  PATIENT DISPOSITION:  PACU - hemodynamically stable.

## 2015-04-16 NOTE — Anesthesia Procedure Notes (Signed)
Procedure Name: LMA Insertion Date/Time: 04/16/2015 1:06 PM Performed by: Aline Brochure Pre-anesthesia Checklist: Patient identified, Emergency Drugs available, Suction available and Patient being monitored Patient Re-evaluated:Patient Re-evaluated prior to inductionOxygen Delivery Method: Circle system utilized Preoxygenation: Pre-oxygenation with 100% oxygen Intubation Type: IV induction LMA: LMA inserted LMA Size: 4.5 Number of attempts: 1 Placement Confirmation: positive ETCO2 and breath sounds checked- equal and bilateral Tube secured with: Tape Dental Injury: Teeth and Oropharynx as per pre-operative assessment

## 2015-04-16 NOTE — Anesthesia Postprocedure Evaluation (Signed)
Anesthesia Post Note  Patient: Carlos Mccarty.  Procedure(s) Performed: Procedure(s) (LRB): nailbed excision left middle finger (Left)  Patient location during evaluation: PACU Anesthesia Type: General Level of consciousness: awake Pain management: satisfactory to patient Vital Signs Assessment: post-procedure vital signs reviewed and stable Respiratory status: respiratory function stable Cardiovascular status: stable Anesthetic complications: no    Last Vitals:  Filed Vitals:   04/16/15 1344 04/16/15 1345  BP: 133/67 133/67  Pulse: 71 71  Temp:    Resp: 24 12    Last Pain: There were no vitals filed for this visit.               VAN STAVEREN,Ryett Hamman

## 2015-04-16 NOTE — Discharge Instructions (Addendum)
Keep dressing clean and dry. If there is bloody drainage apply additional gauze to the fingerAMBULATORY SURGERY  DISCHARGE INSTRUCTIONS   1) The drugs that you were given will stay in your system until tomorrow so for the next 24 hours you should not:  A) Drive an automobile B) Make any legal decisions C) Drink any alcoholic beverage   2) You may resume regular meals tomorrow.  Today it is better to start with liquids and gradually work up to solid foods.  You may eat anything you prefer, but it is better to start with liquids, then soup and crackers, and gradually work up to solid foods.   3) Please notify your doctor immediately if you have any unusual bleeding, trouble breathing, redness and pain at the surgery site, drainage, fever, or pain not relieved by medication.    4) Additional Instructions:        Please contact your physician with any problems or Same Day Surgery at [phone], Monday through Friday 6 am to 4 pm, or [place] at [facility] number at [phone].AMBULATORY SURGERY  DISCHARGE INSTRUCTIONS   5) The drugs that you were given will stay in your system until tomorrow so for the next 24 hours you should not:  D) Drive an automobile E) Make any legal decisions F) Drink any alcoholic beverage   6) You may resume regular meals tomorrow.  Today it is better to start with liquids and gradually work up to solid foods.  You may eat anything you prefer, but it is better to start with liquids, then soup and crackers, and gradually work up to solid foods.   7) Please notify your doctor immediately if you have any unusual bleeding, trouble breathing, redness and pain at the surgery site, drainage, fever, or pain not relieved by medication.    8) Additional Instructions:        Please contact your physician with any problems or Same Day Surgery at [phone], Monday through Friday 6 am to 4 pm, or [place] at [facility] number at  [phone].

## 2015-04-16 NOTE — Progress Notes (Signed)
Denies pain. Left hand elevated on a pillow. Ice pack to left hand.

## 2015-10-04 ENCOUNTER — Encounter: Payer: Self-pay | Admitting: Medical

## 2015-10-04 ENCOUNTER — Ambulatory Visit (INDEPENDENT_AMBULATORY_CARE_PROVIDER_SITE_OTHER): Payer: BLUE CROSS/BLUE SHIELD | Admitting: Medical

## 2015-10-04 VITALS — BP 100/70 | HR 85 | Temp 98.6°F | Ht 76.0 in | Wt 260.4 lb

## 2015-10-04 DIAGNOSIS — K649 Unspecified hemorrhoids: Secondary | ICD-10-CM

## 2015-10-04 MED ORDER — HYDROCORTISONE ACETATE 25 MG RE SUPP: 25.0000 mg | Freq: Two times a day (BID) | RECTAL | 0 refills | Status: DC

## 2015-10-04 MED ORDER — DICLOFENAC SODIUM 75 MG PO TBEC: 75.0000 mg | DELAYED_RELEASE_TABLET | Freq: Two times a day (BID) | ORAL | 0 refills | Status: DC

## 2015-10-04 MED FILL — DICLOFENAC SOD EC 75 MG TAB: 75 | 30 days supply | Qty: 60 | Fill #0

## 2015-10-04 MED FILL — ANUCORT-HC 25 MG SUPPOSITOR: 25 | 14 days supply | Qty: 28 | Fill #0

## 2015-10-04 NOTE — Patient Instructions (Addendum)
For your moderate large hemorrhoid will rx annusol hc suppository. Use daily suppository and do sitz.   I will go ahead and try to refer you for surgical evaluation for hemorrhoid.  Follow up 7-10 days here if referral to surgeon delayed.   Also want you to go ahead in near future schedule for.   For pain if reoccurs will rx diclofenac. Try this first. You are hesitant to use any narcotic. If pain flares then may consider tramadol if needed  Hemorrhoids Hemorrhoids are swollen veins around the rectum or anus. There are two types of hemorrhoids:   Internal hemorrhoids. These occur in the veins just inside the rectum. They may poke through to the outside and become irritated and painful.  External hemorrhoids. These occur in the veins outside the anus and can be felt as a painful swelling or hard lump near the anus. CAUSES  Pregnancy.   Obesity.   Constipation or diarrhea.   Straining to have a bowel movement.   Sitting for long periods on the toilet.  Heavy lifting or other activity that caused you to strain.  Anal intercourse. SYMPTOMS   Pain.   Anal itching or irritation.   Rectal bleeding.   Fecal leakage.   Anal swelling.   One or more lumps around the anus.  DIAGNOSIS  Your caregiver may be able to diagnose hemorrhoids by visual examination. Other examinations or tests that may be performed include:   Examination of the rectal area with a gloved hand (digital rectal exam).   Examination of anal canal using a small tube (scope).   A blood test if you have lost a significant amount of blood.  A test to look inside the colon (sigmoidoscopy or colonoscopy). TREATMENT Most hemorrhoids can be treated at home. However, if symptoms do not seem to be getting better or if you have a lot of rectal bleeding, your caregiver may perform a procedure to help make the hemorrhoids get smaller or remove them completely. Possible treatments include:   Placing a  rubber band at the base of the hemorrhoid to cut off the circulation (rubber band ligation).   Injecting a chemical to shrink the hemorrhoid (sclerotherapy).   Using a tool to burn the hemorrhoid (infrared light therapy).   Surgically removing the hemorrhoid (hemorrhoidectomy).   Stapling the hemorrhoid to block blood flow to the tissue (hemorrhoid stapling).  HOME CARE INSTRUCTIONS   Eat foods with fiber, such as whole grains, beans, nuts, fruits, and vegetables. Ask your doctor about taking products with added fiber in them (fibersupplements).  Increase fluid intake. Drink enough water and fluids to keep your urine clear or pale yellow.   Exercise regularly.   Go to the bathroom when you have the urge to have a bowel movement. Do not wait.   Avoid straining to have bowel movements.   Keep the anal area dry and clean. Use wet toilet paper or moist towelettes after a bowel movement.   Medicated creams and suppositories may be used or applied as directed.   Only take over-the-counter or prescription medicines as directed by your caregiver.   Take warm sitz baths for 15-20 minutes, 3-4 times a day to ease pain and discomfort.   Place ice packs on the hemorrhoids if they are tender and swollen. Using ice packs between sitz baths may be helpful.   Put ice in a plastic bag.   Place a towel between your skin and the bag.   Leave the ice on for  15-20 minutes, 3-4 times a day.   Do not use a donut-shaped pillow or sit on the toilet for long periods. This increases blood pooling and pain.  SEEK MEDICAL CARE IF:  You have increasing pain and swelling that is not controlled by treatment or medicine.  You have uncontrolled bleeding.  You have difficulty or you are unable to have a bowel movement.  You have pain or inflammation outside the area of the hemorrhoids. MAKE SURE YOU:  Understand these instructions.  Will watch your condition.  Will get help right  away if you are not doing well or get worse.   This information is not intended to replace advice given to you by your health care provider. Make sure you discuss any questions you have with your health care provider.   Document Released: 01/03/2000 Document Revised: 12/23/2011 Document Reviewed: 11/10/2011 Elsevier Interactive Patient Education [employer].

## 2015-10-04 NOTE — Progress Notes (Signed)
Subjective:    Patient ID: Carlos Mccarty., male    DOB: September 15, 1960, 55 y.o.   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 Alcohol. Pt played some basketball in [place] when younger. Pt had 3 years college in [place].   Pt states he was just seen recently at [facility]. Pt has some recent hemorrhoid flare. Pt had this for 2 weeks. Pt had old suppository rx. But these were 56 years old. Did not work. Pt has used preparation H max strength. Stopped itching but size of hemorrhoid has not decreased. Pt went to work last night. Able to walk and work. This morning after work can feel hemorrhoid but not painful today.   Pt states [name] has him scheduled out for one month. Pt would like to see sooner than a month.   Pt declines any pain meds. One time felt dizzy and confused with percocet ago.   Review of Systems  Constitutional: Negative for chills and fatigue.  HENT: Negative for congestion.   Respiratory: Negative for cough, chest tightness, shortness of breath and wheezing.   Cardiovascular: Negative for chest pain and palpitations.  Gastrointestinal: Negative for abdominal distention, anal bleeding, blood in stool, constipation, diarrhea, nausea and vomiting.       Rectal region pain. Hemorrhoid type pain.  Musculoskeletal: Negative for back pain and gait problem.  Skin: Negative for rash.  Neurological: Negative for dizziness, speech difficulty, weakness, numbness and headaches.  Hematological: Negative for adenopathy. Does not bruise/bleed easily.  Psychiatric/Behavioral: Negative for behavioral problems and confusion.    No past medical history on file.   Social History   Social History  . Marital status: Single    Spouse name: N/A  . Number of children: N/A  . Years of education: N/A   Occupational History  .  Not on file.   Social History Main Topics  . Smoking status: Never Smoker  . Smokeless tobacco: Never Used  . Alcohol use Yes     Comment: OCC. When watches sports 1 -3 beers on weekends.  . Drug use: No  . Sexual activity: Yes   Other Topics Concern  . Not on file   Social History Narrative  . No narrative on file    Past Surgical History:  Procedure Laterality Date  . ARTHROSCOPIC REPAIR ACL    . FINGER AMPUTATION    . MENISCUS REPAIR    . NAILBED REPAIR Left 04/16/2015   Procedure: nailbed excision left middle finger;  Surgeon: Hessie Knows, MD;  Location: [facility] ORS;  Service: Orthopedics;  Laterality: Left;  . THUMB ARTHROSCOPY    . WRIST FUSION Right     Family History  Problem Relation Age of Onset  . Dementia Mother   . Hypertension Father     No Known Allergies  Current Outpatient Prescriptions on File Prior to Visit  Medication Sig Dispense Refill  . Multiple Vitamin (MULTIVITAMIN) tablet Take 1 tablet by mouth daily.     No current facility-administered medications on file prior to visit.     BP 100/70   Pulse 85   Temp 98.6 F (37 C) (Oral)   Ht 6\' 4"  (1.93 m)   Wt 260 lb 6.4 oz (118.1 kg)   SpO2 99%   BMI 31.70 kg/m       Objective:   Physical  Exam  General Mental Status- Alert. General Appearance- Not in acute distress.   Skin General: Color- Normal Color. Moisture- Normal Moisture.  Neck Carotid Arteries- Normal color. Moisture- Normal Moisture. No carotid bruits. No JVD.  Chest and Lung Exam Auscultation: Breath Sounds:-Normal.  Cardiovascular Auscultation:Rythm- Regular. Murmurs & Other Heart Sounds:Auscultation of the heart reveals- No Murmurs.  Abdomen Inspection:-Inspeection Normal. Palpation/Percussion:Note:No mass. Palpation and Percussion of the abdomen reveal- Non Tender, Non Distended + BS, no rebound or guarding.  Neurologic Cranial Nerve exam:- CN III-XII intact(No nystagmus), symmetric smile. Strength:- 5/5  equal and symmetric strength both upper and lower extremities.  Rectal exam- rt side 9 o clock position. Approximate 1.5 cm x 1 enlarged hemorrhoid. Not tender to palpation presently.(small area faint bluish tint to small area). Maybe early thrombosing.       Assessment & Plan:  For your moderate large hemorrhoid will rx annusol hc suppository. Use daily suppository and do sitz.   I will go ahead and try to refer you for surgical evaluation for hemorrhoid.  Follow up 7-10 days here if referral to surgeon delayed.   Also want you to go ahead in near future schedule for.   For pain if reoccurs will rx diclofenac. Try this first. You are hesitant to use any narcotic. If pain flares then may consider tramadol if needed  Counseled pt on importance of getting routine CPE.(Getting soon)

## 2015-10-18 ENCOUNTER — Encounter: Payer: Self-pay | Admitting: Medical

## 2015-10-18 ENCOUNTER — Ambulatory Visit (INDEPENDENT_AMBULATORY_CARE_PROVIDER_SITE_OTHER): Payer: BLUE CROSS/BLUE SHIELD | Admitting: Medical

## 2015-10-18 VITALS — BP 120/76 | HR 88 | Temp 97.7°F | Ht 76.0 in | Wt 265.2 lb

## 2015-10-18 DIAGNOSIS — K649 Unspecified hemorrhoids: Secondary | ICD-10-CM

## 2015-10-18 NOTE — Patient Instructions (Addendum)
Your hemorrhoid looks better overall and you no longer have pain. I made the referral. In light of size of hemorrhoid I think appointment may be good idea to discuss option of procedure now vs later in event enlarges and becomes more painful in future.  Please get flu vaccine at your work.  Please go ahead and schedule CPE fasting sometime in October.   Our staff will call when surgeon office contacts [place].

## 2015-10-18 NOTE — Progress Notes (Signed)
Subjective:    Patient ID: Carlos Majors., male    DOB: March 04, 1960, 55 y.o.   MRN: [id number]  HPI  Pt in for follow up.  Pt states his hemorrhoids feel better and he states they are smaller. Does not feel them any more. Pt used anusol suppository. He did not have to take diclofenac.   I put referral to surgeon in and in process. Pt not sure if he got call from specialist.  Pt not sure he wants to see specialist.  Pt states this last event was first event that every occurred.  Pt states he is signed up for flu vaccine at work. Advised and counseled to get.  No past medical history on file.   Social History   Social History  . Marital status: Single    Spouse name: N/A  . Number of children: N/A  . Years of education: N/A   Occupational History  . Not on file.   Social History Main Topics  . Smoking status: Never Smoker  . Smokeless tobacco: Never Used  . Alcohol use Yes     Comment: OCC. When watches sports 1 -3 beers on weekends.  . Drug use: No  . Sexual activity: Yes   Other Topics Concern  . Not on file   Social History Narrative  . No narrative on file    Past Surgical History:  Procedure Laterality Date  . ARTHROSCOPIC REPAIR ACL    . FINGER AMPUTATION    . MENISCUS REPAIR    . NAILBED REPAIR Left 04/16/2015   Procedure: nailbed excision left middle finger;  Surgeon: Hessie Knows, MD;  Location: [facility] ORS;  Service: Orthopedics;  Laterality: Left;  . THUMB ARTHROSCOPY    . WRIST FUSION Right     Family History  Problem Relation Age of Onset  . Dementia Mother   . Hypertension Father     No Known Allergies  Current Outpatient Prescriptions on File Prior to Visit  Medication Sig Dispense Refill  . diclofenac (VOLTAREN) 75 MG EC tablet Take 1 tablet (75 mg total) by mouth 2 (two) times daily. 60 tablet 0  . hydrocortisone (ANUSOL-HC) 25 MG suppository Place 1 suppository (25 mg total) rectally 2 (two) times daily. 28 suppository 0  . Multiple  Vitamin (MULTIVITAMIN) tablet Take 1 tablet by mouth daily.    . psyllium (METAMUCIL) 0.52 g capsule Take 0.52 g by mouth daily.     No current facility-administered medications on file prior to visit.     BP 120/76   Pulse 88   Temp 97.7 F (36.5 C) (Oral)   Ht 6\' 4"  (1.93 m)   Wt 265 lb 3.2 oz (120.3 kg)   SpO2 98%   BMI 32.28 kg/m     Review of Systems  Constitutional: Negative for chills, fatigue and fever.  Respiratory: Negative for cough, chest tightness, shortness of breath and wheezing.   Cardiovascular: Negative for chest pain and palpitations.  Gastrointestinal: Negative for abdominal distention, abdominal pain, blood in stool, constipation, diarrhea, nausea, rectal pain and vomiting.       See hpi.  Musculoskeletal: Negative for back pain.  Skin: Negative for rash.  Neurological: Negative for dizziness and headaches.  Hematological: Negative for adenopathy. Does not bruise/bleed easily.  Psychiatric/Behavioral: Negative for behavioral problems and confusion.       Objective:   Physical Exam   General- No acute distress. Pleasant patient. Neck- Full range of motion, no jvd Lungs- Clear, even and unlabored.  Heart- regular rate and rhythm. Neurologic- CNII- XII grossly intact.   Rectal exam- rt side 9 o clock position. Approximate 1.0 cm x 1.0 enlarged hemorrhoid. Not tender to palpation presently.(No bluish tent to skin today). Overall improved.     Assessment & Plan:  Your hemorrhoid looks better overall and you no longer have pain. I made the referral. In light of size of hemorrhoid I think appointment may be good idea to discuss option of procedure now vs later in event enlarges and becomes more painful in future.  Please get flu vaccine at your work.  Please go ahead and schedule CPE fasting sometime in October.     Royston Bekele, Percell Miller, PA-C

## 2015-10-18 NOTE — Progress Notes (Signed)
Pre visit review using our clinic tool,if applicable. No additional management support is needed unless otherwise documented below in the visit note.    Has flu shot scheduled at job.

## 2015-11-08 ENCOUNTER — Ambulatory Visit (INDEPENDENT_AMBULATORY_CARE_PROVIDER_SITE_OTHER): Payer: BLUE CROSS/BLUE SHIELD | Admitting: Medical

## 2015-11-08 ENCOUNTER — Telehealth: Payer: Self-pay

## 2015-11-08 ENCOUNTER — Telehealth: Payer: Self-pay | Admitting: Medical

## 2015-11-08 ENCOUNTER — Encounter: Payer: Self-pay | Admitting: Medical

## 2015-11-08 VITALS — BP 120/80 | HR 72 | Temp 98.1°F | Ht 76.0 in | Wt 266.2 lb

## 2015-11-08 DIAGNOSIS — Z113 Encounter for screening for infections with a predominantly sexual mode of transmission: Secondary | ICD-10-CM | POA: Diagnosis not present

## 2015-11-08 DIAGNOSIS — E039 Hypothyroidism, unspecified: Secondary | ICD-10-CM

## 2015-11-08 DIAGNOSIS — Z1211 Encounter for screening for malignant neoplasm of colon: Secondary | ICD-10-CM

## 2015-11-08 DIAGNOSIS — Z125 Encounter for screening for malignant neoplasm of prostate: Secondary | ICD-10-CM | POA: Diagnosis not present

## 2015-11-08 DIAGNOSIS — Z0000 Encounter for general adult medical examination without abnormal findings: Secondary | ICD-10-CM

## 2015-11-08 LAB — CBC WITH DIFFERENTIAL/PLATELET
BASOS PCT: 0.8 % (ref 0.0–3.0)
Basophils Absolute: 0 10*3/uL (ref 0.0–0.1)
EOS PCT: 5 % (ref 0.0–5.0)
Eosinophils Absolute: 0.3 10*3/uL (ref 0.0–0.7)
HCT: 44.3 % (ref 39.0–52.0)
Hemoglobin: 14.9 g/dL (ref 13.0–17.0)
LYMPHS ABS: 2.2 10*3/uL (ref 0.7–4.0)
Lymphocytes Relative: 41.1 % (ref 12.0–46.0)
MCHC: 33.5 g/dL (ref 30.0–36.0)
MCV: 81 fl (ref 78.0–100.0)
MONO ABS: 0.4 10*3/uL (ref 0.1–1.0)
Monocytes Relative: 8.3 % (ref 3.0–12.0)
NEUTROS PCT: 44.8 % (ref 43.0–77.0)
Neutro Abs: 2.3 10*3/uL (ref 1.4–7.7)
PLATELETS: 227 10*3/uL (ref 150.0–400.0)
RBC: 5.47 Mil/uL (ref 4.22–5.81)
RDW: 14.2 % (ref 11.5–15.5)
WBC: 5.2 10*3/uL (ref 4.0–10.5)

## 2015-11-08 LAB — COMPREHENSIVE METABOLIC PANEL
ALT: 21 U/L (ref 0–53)
AST: 22 U/L (ref 0–37)
Albumin: 4.4 g/dL (ref 3.5–5.2)
Alkaline Phosphatase: 71 U/L (ref 39–117)
BUN: 15 mg/dL (ref 6–23)
CHLORIDE: 107 meq/L (ref 96–112)
CO2: 29 meq/L (ref 19–32)
Calcium: 9.3 mg/dL (ref 8.4–10.5)
Creatinine, Ser: 1.4 mg/dL (ref 0.40–1.50)
GFR: 67.28 mL/min (ref >60.00)
GLUCOSE: 100 mg/dL — AB (ref 70–99)
POTASSIUM: 4.1 meq/L (ref 3.5–5.1)
SODIUM: 140 meq/L (ref 135–145)
Total Bilirubin: 0.6 mg/dL (ref 0.2–1.2)
Total Protein: 7.7 g/dL (ref 6.0–8.3)

## 2015-11-08 LAB — LIPID PANEL
CHOL/HDL RATIO: 3
Cholesterol: 168 mg/dL (ref 0–200)
HDL: 49.1 mg/dL (ref >39.00)
LDL Cholesterol: 103 mg/dL — ABNORMAL HIGH (ref 0–99)
NONHDL: 119.35
Triglycerides: 83 mg/dL (ref 0.0–149.0)
VLDL: 16.6 mg/dL (ref 0.0–40.0)

## 2015-11-08 LAB — PSA: PSA: 1.08 ng/mL (ref 0.10–4.00)

## 2015-11-08 LAB — TSH: TSH: 9.36 u[IU]/mL — AB (ref 0.35–4.50)

## 2015-11-08 MED ORDER — HYDROCORTISONE ACETATE 25 MG RE SUPP
25.0000 mg | Freq: Two times a day (BID) | RECTAL | 0 refills | Status: DC
Start: 2015-11-08 — End: 2016-07-26

## 2015-11-08 MED FILL — ANUCORT-HC 25 MG SUPPOSITOR: 25 | 14 days supply | Qty: 28 | Fill #0

## 2015-11-08 NOTE — Progress Notes (Signed)
Subjective:    Patient ID: Carlos Majors., male    DOB: 30-Apr-1960, 55 y.o.   MRN: [id number]  HPI  Pt in for CPE.  Pt is fasting. Pt is up to date on his his tetanus and his flu vaccine.  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 Alcohol. Pt played some basketball in [place] when younger. Pt had 3 years college in [employer].  Pt states his hemorrhoid is completely gone today.     Review of Systems  Constitutional: Negative for chills, fatigue and fever.  HENT: Negative for congestion, ear pain, nosebleeds, postnasal drip, sinus pressure and trouble swallowing.   Respiratory: Negative for cough, chest tightness, shortness of breath and wheezing.   Cardiovascular: Negative for chest pain and palpitations.  Gastrointestinal: Negative for abdominal pain, blood in stool, constipation, diarrhea, nausea and vomiting.  Genitourinary: Negative for dysuria, flank pain, frequency, scrotal swelling and urgency.  Musculoskeletal: Negative for back pain and gait problem.  Skin: Negative for rash.  Neurological: Negative for dizziness, weakness, light-headedness, numbness and headaches.  Hematological: Negative for adenopathy. Does not bruise/bleed easily.  Psychiatric/Behavioral: Negative for behavioral problems, confusion and sleep disturbance. The patient is not nervous/anxious.     No past medical history on file.   Social History   Social History  . Marital status: Single    Spouse name: N/A  . Number of children: N/A  . Years of education: N/A   Occupational History  . Not on file.   Social History Main Topics  . Smoking status: Never Smoker  . Smokeless tobacco: Never Used  . Alcohol use Yes     Comment: OCC. When watches sports 1 -3 beers on weekends.  . Drug use: No  . Sexual activity: Yes   Other Topics Concern  . Not on file   Social History Narrative  . No narrative on file    Past Surgical History:    Procedure Laterality Date  . ARTHROSCOPIC REPAIR ACL    . FINGER AMPUTATION    . MENISCUS REPAIR    . NAILBED REPAIR Left 04/16/2015   Procedure: nailbed excision left middle finger;  Surgeon: Hessie Knows, MD;  Location: [facility] ORS;  Service: Orthopedics;  Laterality: Left;  . THUMB ARTHROSCOPY    . WRIST FUSION Right     Family History  Problem Relation Age of Onset  . Dementia Mother   . Hypertension Father     No Known Allergies  Current Outpatient Prescriptions on File Prior to Visit  Medication Sig Dispense Refill  . diclofenac (VOLTAREN) 75 MG EC tablet Take 1 tablet (75 mg total) by mouth 2 (two) times daily. 60 tablet 0  . Multiple Vitamin (MULTIVITAMIN) tablet Take 1 tablet by mouth daily.    . psyllium (METAMUCIL) 0.52 g capsule Take 0.52 g by mouth daily.     No current facility-administered medications on file prior to visit.     BP 120/80   Pulse (!) 45   Temp 98.1 F (36.7 C) (Oral)   Ht 6\' 4"  (1.93 m)   Wt 266 lb 3.2 oz (120.7 kg)   SpO2 97%   BMI 32.40 kg/m       Objective:   Physical Exam  General Mental Status- Alert. General Appearance- Not in acute distress.   Skin General: Color- Normal Color. Moisture- Normal Moisture.  Neck Carotid Arteries- Normal color. Moisture- Normal Moisture. No carotid bruits. No JVD.  Chest and  Lung Exam Auscultation: Breath Sounds:-Normal.  Cardiovascular Auscultation:Rythm- Regular. Murmurs & Other Heart Sounds:Auscultation of the heart reveals- No Murmurs.   Neurologic Cranial Nerve exam:- CN III-XII intact(No nystagmus), symmetric smile. Strength:- 5/5 equal and symmetric strength both upper and lower extremities.  Genitourinary- normal penis, normal testicles. No hernia.  .Abdomen Inspection:-Inspection Normal. Inspection of the abdomen reveals- No hernias Palpation/Percussion:- Palpation and Percussion of the Abdomen reveal- Non Tender and No Palpable abdominal masses. Liver: Other  Characteristics- No hepatomegaly. Spleen:Other Characteristics- No Splenomegaly. Auscultation:- Auscultation of the abdomen reveals- Bowel sounds normal and No Abdominal bruits. Only very tiny. Possible umbilical hernia felt. Reducible. Non tender(advised pt on)  Rectal Anorectal Exam: Performed- Normal sphincter tone. No masses noted. Prostate smooth normal size. Stool HEME Negative. Prior hemorrhoid resolved completely. No present.     Assessment & Plan:  For your wellness exam will get cbc, cmp, tsh, lipid, hiv psa and ua dip.  Will refer you to GI for colonoscopy. Anderson Malta and Steffanie Dunn are contact person in our office if [place] office does not call you.  You are up to date on flu vaccine and t-dap.  Healthy diet and exercise.   If your hemorrhoid flares again let us know.(also did rx his anusol hc. To have on hand and use early if he were to get a flare again).  Follow up date to be determined after lab review.  Placed urine dip in today. But lab did not see and pt did not give sample. Will ask lab to call pt and have in come in later within 2 weeks. Associate with the wellness exam.   Dovey Fatzinger, Percell Miller, PA-C

## 2015-11-08 NOTE — Patient Instructions (Addendum)
For your wellness exam will get cbc, cmp, tsh, lipid, hiv, psa and ua dip.  Will refer you to GI for colonoscopy. Anderson Malta and Steffanie Dunn are contact person in our office if [place] office does not call you.  You are up to date on flu vaccine and t-dap.  Healthy diet and exercise.   If your hemorrhoid flares again let us know.   Follow up date to be determined after lab review.      Preventive Care for Adults, Male A healthy lifestyle and preventive care can promote health and wellness. Preventive health guidelines for men include the following key practices:  A routine yearly physical is a good way to check with your health care provider about your health and preventative screening. It is a chance to share any concerns and updates on your health and to receive a thorough exam.  Visit your dentist for a routine exam and preventative care every 6 months. Brush your teeth twice a day and floss once a day. Good oral hygiene prevents tooth decay and gum disease.  The frequency of eye exams is based on your age, health, family medical history, use of contact lenses, and other factors. Follow your health care provider's recommendations for frequency of eye exams.  Eat a healthy diet. Foods such as vegetables, fruits, whole grains, low-fat dairy products, and lean protein foods contain the nutrients you need without too many calories. Decrease your intake of foods high in solid fats, added sugars, and salt. Eat the right amount of calories for you.Get information about a proper diet from your health care provider, if necessary.  Regular physical exercise is one of the most important things you can do for your health. Most adults should get at least 150 minutes of moderate-intensity exercise (any activity that increases your heart rate and causes you to sweat) each week. In addition, most adults need muscle-strengthening exercises on 2 or more days a week.  Maintain a healthy weight. The body mass  index (BMI) is a screening tool to identify possible weight problems. It provides an estimate of body fat based on height and weight. Your health care provider can find your BMI and can help you achieve or maintain a healthy weight.For adults 20 years and older:  A BMI below 18.5 is considered underweight.  A BMI of 18.5 to 24.9 is normal.  A BMI of 25 to 29.9 is considered overweight.  A BMI of 30 and above is considered obese.  Maintain normal blood lipids and cholesterol levels by exercising and minimizing your intake of saturated fat. Eat a balanced diet with plenty of fruit and vegetables. Blood tests for lipids and cholesterol should begin at age 7 and be repeated every 5 years. If your lipid or cholesterol levels are high, you are over 50, or you are at high risk for heart disease, you may need your cholesterol levels checked more frequently.Ongoing high lipid and cholesterol levels should be treated with medicines if diet and exercise are not working.  If you smoke, find out from your health care provider how to quit. If you do not use tobacco, do not start.  Lung cancer screening is recommended for adults aged 81-80 years who are at high risk for developing lung cancer because of a history of smoking. A yearly low-dose CT scan of the lungs is recommended for people who have at least a 30-pack-year history of smoking and are a current smoker or have quit within the past 15 years. A pack  year of smoking is smoking an average of 1 pack of cigarettes a day for 1 year (for example: 1 pack a day for 30 years or 2 packs a day for 15 years). Yearly screening should continue until the smoker has stopped smoking for at least 15 years. Yearly screening should be stopped for people who develop a health problem that would prevent them from having lung cancer treatment.  If you choose to drink alcohol, do not have more than 2 drinks per day. One drink is considered to be 12 ounces (355 mL) of beer, 5  ounces (148 mL) of wine, or 1.5 ounces (44 mL) of liquor.  Avoid use of street drugs. Do not share needles with anyone. Ask for help if you need support or instructions about stopping the use of drugs.  High blood pressure causes heart disease and increases the risk of stroke. Your blood pressure should be checked at least every 1-2 years. Ongoing high blood pressure should be treated with medicines, if weight loss and exercise are not effective.  If you are 34-37 years old, ask your health care provider if you should take aspirin to prevent heart disease.  Diabetes screening is done by taking a blood sample to check your blood glucose level after you have not eaten for a certain period of time (fasting). If you are not overweight and you do not have risk factors for diabetes, you should be screened once every 3 years starting at age 70. If you are overweight or obese and you are 11-49 years of age, you should be screened for diabetes every year as part of your cardiovascular risk assessment.  Colorectal cancer can be detected and often prevented. Most routine colorectal cancer screening begins at the age of 60 and continues through age 35. However, your health care provider may recommend screening at an earlier age if you have risk factors for colon cancer. On a yearly basis, your health care provider may provide home test kits to check for hidden blood in the stool. Use of a small camera at the end of a tube to directly examine the colon (sigmoidoscopy or colonoscopy) can detect the earliest forms of colorectal cancer. Talk to your health care provider about this at age 16, when routine screening begins. Direct exam of the colon should be repeated every 5-10 years through age 43, unless early forms of precancerous polyps or small growths are found.  People who are at an increased risk for hepatitis B should be screened for this virus. You are considered at high risk for hepatitis B if:  You were born  in a country where hepatitis B occurs often. Talk with your health care provider about which countries are considered high risk.  Your parents were born in a high-risk country and you have not received a shot to protect against hepatitis B (hepatitis B vaccine).  You have HIV or AIDS.  You use needles to inject street drugs.  You live with, or have sex with, someone who has hepatitis B.  You are a man who has sex with other men (MSM).  You get hemodialysis treatment.  You take certain medicines for conditions such as cancer, organ transplantation, and autoimmune conditions.  Hepatitis C blood testing is recommended for all people born from [birth year] through 1965 and any individual with known risks for hepatitis C.  Practice safe sex. Use condoms and avoid high-risk sexual practices to reduce the spread of sexually transmitted infections (STIs). STIs include gonorrhea, chlamydia,  syphilis, trichomonas, herpes, HPV, and human immunodeficiency virus (HIV). Herpes, HIV, and HPV are viral illnesses that have no cure. They can result in disability, cancer, and death.  If you are a man who has sex with other men, you should be screened at least once per year for:  HIV.  Urethral, rectal, and pharyngeal infection of gonorrhea, chlamydia, or both.  If you are at risk of being infected with HIV, it is recommended that you take a prescription medicine daily to prevent HIV infection. This is called preexposure prophylaxis (PrEP). You are considered at risk if:  You are a man who has sex with other men (MSM) and have other risk factors.  You are a heterosexual man, are sexually active, and are at increased risk for HIV infection.  You take drugs by injection.  You are sexually active with a partner who has HIV.  Talk with your health care provider about whether you are at high risk of being infected with HIV. If you choose to begin PrEP, you should first be tested for HIV. You should then be tested  every 3 months for as long as you are taking PrEP.  A one-time screening for abdominal aortic aneurysm (AAA) and surgical repair of large AAAs by ultrasound are recommended for men ages 69 to 40 years who are current or former smokers.  Healthy men should no longer receive prostate-specific antigen (PSA) blood tests as part of routine cancer screening. Talk with your health care provider about prostate cancer screening.  Testicular cancer screening is not recommended for adult males who have no symptoms. Screening includes self-exam, a health care provider exam, and other screening tests. Consult with your health care provider about any symptoms you have or any concerns you have about testicular cancer.  Use sunscreen. Apply sunscreen liberally and repeatedly throughout the day. You should seek shade when your shadow is shorter than you. Protect yourself by wearing long sleeves, pants, a wide-brimmed hat, and sunglasses year round, whenever you are outdoors.  Once a month, do a whole-body skin exam, using a mirror to look at the skin on your back. Tell your health care provider about new moles, moles that have irregular borders, moles that are larger than a pencil eraser, or moles that have changed in shape or color.  Stay current with required vaccines (immunizations).  Influenza vaccine. All adults should be immunized every year.  Tetanus, diphtheria, and acellular pertussis (Td, Tdap) vaccine. An adult who has not previously received Tdap or who does not know his vaccine status should receive 1 dose of Tdap. This initial dose should be followed by tetanus and diphtheria toxoids (Td) booster doses every 10 years. Adults with an unknown or incomplete history of completing a 3-dose immunization series with Td-containing vaccines should begin or complete a primary immunization series including a Tdap dose. Adults should receive a Td booster every 10 years.  Varicella vaccine. An adult without  evidence of immunity to varicella should receive 2 doses or a second dose if he has previously received 1 dose.  Human papillomavirus (HPV) vaccine. Males aged 11-21 years who have not received the vaccine previously should receive the 3-dose series. Males aged 22-26 years may be immunized. Immunization is recommended through the age of 27 years for any male who has sex with males and did not get any or all doses earlier. Immunization is recommended for any person with an immunocompromised condition through the age of 33 years if he did not get any  or all doses earlier. During the 3-dose series, the second dose should be obtained 4-8 weeks after the first dose. The third dose should be obtained 24 weeks after the first dose and 16 weeks after the second dose.  Zoster vaccine. One dose is recommended for adults aged 32 years or older unless certain conditions are present.  Measles, mumps, and rubella (MMR) vaccine. Adults born before [birth year] generally are considered immune to measles and mumps. Adults born in [birth year] or later should have 1 or more doses of MMR vaccine unless there is a contraindication to the vaccine or there is laboratory evidence of immunity to each of the three diseases. A routine second dose of MMR vaccine should be obtained at least 28 days after the first dose for students attending postsecondary schools, health care workers, or international travelers. People who received inactivated measles vaccine or an unknown type of measles vaccine during 1963-1967 should receive 2 doses of MMR vaccine. People who received inactivated mumps vaccine or an unknown type of mumps vaccine before 1979 and are at high risk for mumps infection should consider immunization with 2 doses of MMR vaccine. Unvaccinated health care workers born before [birth year] who lack laboratory evidence of measles, mumps, or rubella immunity or laboratory confirmation of disease should consider measles and mumps immunization with 2 doses  of MMR vaccine or rubella immunization with 1 dose of MMR vaccine.  Pneumococcal 13-valent conjugate (PCV13) vaccine. When indicated, a person who is uncertain of his immunization history and has no record of immunization should receive the PCV13 vaccine. All adults 49 years of age and older should receive this vaccine. An adult aged 34 years or older who has certain medical conditions and has not been previously immunized should receive 1 dose of PCV13 vaccine. This PCV13 should be followed with a dose of pneumococcal polysaccharide (PPSV23) vaccine. Adults who are at high risk for pneumococcal disease should obtain the PPSV23 vaccine at least 8 weeks after the dose of PCV13 vaccine. Adults older than 56 years of age who have normal immune system function should obtain the PPSV23 vaccine dose at least 1 year after the dose of PCV13 vaccine.  Pneumococcal polysaccharide (PPSV23) vaccine. When PCV13 is also indicated, PCV13 should be obtained first. All adults aged 74 years and older should be immunized. An adult younger than age 62 years who has certain medical conditions should be immunized. Any person who resides in a nursing home or long-term care facility should be immunized. An adult smoker should be immunized. People with an immunocompromised condition and certain other conditions should receive both PCV13 and PPSV23 vaccines. People with human immunodeficiency virus (HIV) infection should be immunized as soon as possible after diagnosis. Immunization during chemotherapy or radiation therapy should be avoided. Routine use of PPSV23 vaccine is not recommended for American Indians, [place]atives, or people younger than 65 years unless there are medical conditions that require PPSV23 vaccine. When indicated, people who have unknown immunization and have no record of immunization should receive PPSV23 vaccine. One-time revaccination 5 years after the first dose of PPSV23 is recommended for people aged 19-64  years who have chronic kidney failure, nephrotic syndrome, asplenia, or immunocompromised conditions. People who received 1-2 doses of PPSV23 before age 105 years should receive another dose of PPSV23 vaccine at age 42 years or later if at least 5 years have passed since the previous dose. Doses of PPSV23 are not needed for people immunized with PPSV23 at or after age 61 years.  Meningococcal vaccine. Adults with asplenia or persistent complement component deficiencies should receive 2 doses of quadrivalent meningococcal conjugate (MenACWY-D) vaccine. The doses should be obtained at least 2 months apart. Microbiologists working with certain meningococcal bacteria, [employer] recruits, people at risk during an outbreak, and people who travel to or live in countries with a high rate of meningitis should be immunized. A first-year college student up through age 72 years who is living in a residence hall should receive a dose if he did not receive a dose on or after his 16th birthday. Adults who have certain high-risk conditions should receive one or more doses of vaccine.  Hepatitis A vaccine. Adults who wish to be protected from this disease, have chronic liver disease, work with hepatitis A-infected animals, work in hepatitis A research labs, or travel to or work in countries with a high rate of hepatitis A should be immunized. Adults who were previously unvaccinated and who anticipate close contact with an international adoptee during the first 60 days after arrival in the [place] from a country with a high rate of hepatitis A should be immunized.  Hepatitis B vaccine. Adults should be immunized if they wish to be protected from this disease, are under age 68 years and have diabetes, have chronic liver disease, have had more than one sex partner in the past 6 months, may be exposed to blood or other infectious body fluids, are household contacts or sex partners of hepatitis B positive people, are clients or  workers in certain care facilities, or travel to or work in countries with a high rate of hepatitis B.  Haemophilus influenzae type b (Hib) vaccine. A previously unvaccinated person with asplenia or sickle cell disease or having a scheduled splenectomy should receive 1 dose of Hib vaccine. Regardless of previous immunization, a recipient of a hematopoietic stem cell transplant should receive a 3-dose series 6-12 months after his successful transplant. Hib vaccine is not recommended for adults with HIV infection. Preventive Service / Frequency Ages 70 to 10  Blood pressure check.** / Every 3-5 years.  Lipid and cholesterol check.** / Every 5 years beginning at age 23.  Hepatitis C blood test.** / For any individual with known risks for hepatitis C.  Skin self-exam. / Monthly.  Influenza vaccine. / Every year.  Tetanus, diphtheria, and acellular pertussis (Tdap, Td) vaccine.** / Consult your health care provider. 1 dose of Td every 10 years.  Varicella vaccine.** / Consult your health care provider.  HPV vaccine. / 3 doses over 6 months, if 58 or younger.  Measles, mumps, rubella (MMR) vaccine.** / You need at least 1 dose of MMR if you were born in 1957 or later. You may also need a second dose.  Pneumococcal 13-valent conjugate (PCV13) vaccine.** / Consult your health care provider.  Pneumococcal polysaccharide (PPSV23) vaccine.** / 1 to 2 doses if you smoke cigarettes or if you have certain conditions.  Meningococcal vaccine.** / 1 dose if you are age 64 to 47 years and a [employer] researcher living in a residence hall, or have one of several medical conditions. You may also need additional booster doses.  Hepatitis A vaccine.** / Consult your health care provider.  Hepatitis B vaccine.** / Consult your health care provider.  Haemophilus influenzae type b (Hib) vaccine.** / Consult your health care provider. Ages 59 to 67  Blood pressure check.** / Every year.  Lipid and  cholesterol check.** / Every 5 years beginning at age 78.  Lung cancer screening. /  Every year if you are aged 59-80 years and have a 30-pack-year history of smoking and currently smoke or have quit within the past 15 years. Yearly screening is stopped once you have quit smoking for at least 15 years or develop a health problem that would prevent you from having lung cancer treatment.  Fecal occult blood test (FOBT) of stool. / Every year beginning at age 43 and continuing until age 48. You may not have to do this test if you get a colonoscopy every 10 years.  Flexible sigmoidoscopy** or colonoscopy.** / Every 5 years for a flexible sigmoidoscopy or every 10 years for a colonoscopy beginning at age 35 and continuing until age 21.  Hepatitis C blood test.** / For all people born from [birth year] through 1965 and any individual with known risks for hepatitis C.  Skin self-exam. / Monthly.  Influenza vaccine. / Every year.  Tetanus, diphtheria, and acellular pertussis (Tdap/Td) vaccine.** / Consult your health care provider. 1 dose of Td every 10 years.  Varicella vaccine.** / Consult your health care provider.  Zoster vaccine.** / 1 dose for adults aged 61 years or older.  Measles, mumps, rubella (MMR) vaccine.** / You need at least 1 dose of MMR if you were born in 1958 or later. You may also need a second dose.  Pneumococcal 13-valent conjugate (PCV13) vaccine.** / Consult your health care provider.  Pneumococcal polysaccharide (PPSV23) vaccine.** / 1 to 2 doses if you smoke cigarettes or if you have certain conditions.  Meningococcal vaccine.** / Consult your health care provider.  Hepatitis A vaccine.** / Consult your health care provider.  Hepatitis B vaccine.** / Consult your health care provider.  Haemophilus influenzae type b (Hib) vaccine.** / Consult your health care provider. Ages 89 and over  Blood pressure check.** / Every year.  Lipid and cholesterol check.**/ Every 5 years  beginning at age 64.  Lung cancer screening. / Every year if you are aged 40-80 years and have a 30-pack-year history of smoking and currently smoke or have quit within the past 15 years. Yearly screening is stopped once you have quit smoking for at least 15 years or develop a health problem that would prevent you from having lung cancer treatment.  Fecal occult blood test (FOBT) of stool. / Every year beginning at age 45 and continuing until age 16. You may not have to do this test if you get a colonoscopy every 10 years.  Flexible sigmoidoscopy** or colonoscopy.** / Every 5 years for a flexible sigmoidoscopy or every 10 years for a colonoscopy beginning at age 13 and continuing until age 24.  Hepatitis C blood test.** / For all people born from [birth year] through 1966 and any individual with known risks for hepatitis C.  Abdominal aortic aneurysm (AAA) screening.** / A one-time screening for ages 93 to 52 years who are current or former smokers.  Skin self-exam. / Monthly.  Influenza vaccine. / Every year.  Tetanus, diphtheria, and acellular pertussis (Tdap/Td) vaccine.** / 1 dose of Td every 10 years.  Varicella vaccine.** / Consult your health care provider.  Zoster vaccine.** / 1 dose for adults aged 72 years or older.  Pneumococcal 13-valent conjugate (PCV13) vaccine.** / 1 dose for all adults aged 70 years and older.  Pneumococcal polysaccharide (PPSV23) vaccine.** / 1 dose for all adults aged 94 years and older.  Meningococcal vaccine.** / Consult your health care provider.  Hepatitis A vaccine.** / Consult your health care provider.  Hepatitis B vaccine.** / Consult  your health care provider.  Haemophilus influenzae type b (Hib) vaccine.** / Consult your health care provider. **Family history and personal history of risk and conditions may change your health care provider's recommendations.   This information is not intended to replace advice given to you by your health care  provider. Make sure you discuss any questions you have with your health care provider.   Document Released: 03/03/2001 Document Revised: 01/26/2014 Document Reviewed: 06/02/2010 Elsevier Interactive Patient Education [employer].

## 2015-11-08 NOTE — Progress Notes (Signed)
Pre visit review using our clinic review tool, if applicable. No additional management support is needed unless otherwise documented below in the visit note./hsm  

## 2015-11-08 NOTE — Telephone Encounter (Signed)
Did pt give a urine dip. If so would you result so I can close his chart. If he did not give urine let me know so I can remove the lab.

## 2015-11-08 NOTE — Telephone Encounter (Signed)
Called pt about coming in with in the next 2 weeks to give urine sample. PC

## 2015-11-08 NOTE — Telephone Encounter (Signed)
Just thought. Would you mind calling pt and asking him to return sometime in next 2 weeks. Then associate it with the wellness exam

## 2015-11-08 NOTE — Telephone Encounter (Signed)
Carlos Mccarty a detailed message on the patients VM, he did not answer the phone

## 2015-11-09 ENCOUNTER — Telehealth: Payer: Self-pay | Admitting: Medical

## 2015-11-09 DIAGNOSIS — R7989 Other specified abnormal findings of blood chemistry: Secondary | ICD-10-CM

## 2015-11-09 DIAGNOSIS — R739 Hyperglycemia, unspecified: Secondary | ICD-10-CM

## 2015-11-09 LAB — HIV ANTIBODY (ROUTINE TESTING W REFLEX): HIV: NONREACTIVE

## 2015-11-09 NOTE — Telephone Encounter (Signed)
Future labs placed tsh, t4, and a1c.

## 2015-11-11 NOTE — Addendum Note (Signed)
Addended by: Tasia Catchings on: 11/11/2015 04:19 PM   Modules accepted: Orders

## 2015-11-12 ENCOUNTER — Other Ambulatory Visit (INDEPENDENT_AMBULATORY_CARE_PROVIDER_SITE_OTHER): Payer: BLUE CROSS/BLUE SHIELD

## 2015-11-12 ENCOUNTER — Telehealth: Payer: Self-pay | Admitting: Medical

## 2015-11-12 DIAGNOSIS — E039 Hypothyroidism, unspecified: Secondary | ICD-10-CM

## 2015-11-12 DIAGNOSIS — R739 Hyperglycemia, unspecified: Secondary | ICD-10-CM

## 2015-11-12 LAB — T4, FREE: Free T4: 0.53 ng/dL — ABNORMAL LOW (ref 0.60–1.60)

## 2015-11-12 LAB — HEMOGLOBIN A1C: HEMOGLOBIN A1C: 6.2 % (ref 4.6–6.5)

## 2015-11-12 LAB — TSH: TSH: 11.38 u[IU]/mL — ABNORMAL HIGH (ref 0.35–4.50)

## 2015-11-12 MED ORDER — LEVOTHYROXINE SODIUM 75 MCG PO TABS: 75.0000 ug | ORAL_TABLET | Freq: Every day | ORAL | 0 refills | Status: DC

## 2015-11-12 MED FILL — LEVOTHYROXINE 75 MCG TABLET: 75 | 90 days supply | Qty: 90 | Fill #0

## 2015-11-12 NOTE — Telephone Encounter (Signed)
rx low dose levothyroxine sent to pharmacy.

## 2015-12-19 ENCOUNTER — Other Ambulatory Visit (INDEPENDENT_AMBULATORY_CARE_PROVIDER_SITE_OTHER): Payer: BLUE CROSS/BLUE SHIELD

## 2015-12-19 DIAGNOSIS — E039 Hypothyroidism, unspecified: Secondary | ICD-10-CM | POA: Diagnosis not present

## 2015-12-19 LAB — TSH: TSH: 4.06 u[IU]/mL (ref 0.35–4.50)

## 2015-12-19 LAB — T4, FREE: FREE T4: 0.74 ng/dL (ref 0.60–1.60)

## 2015-12-20 ENCOUNTER — Other Ambulatory Visit: Payer: Self-pay

## 2015-12-20 ENCOUNTER — Ambulatory Visit (AMBULATORY_SURGERY_CENTER): Payer: Self-pay | Admitting: *Deleted

## 2015-12-20 VITALS — Ht 77.0 in | Wt 271.6 lb

## 2015-12-20 DIAGNOSIS — Z1211 Encounter for screening for malignant neoplasm of colon: Secondary | ICD-10-CM

## 2015-12-20 MED ORDER — NA SULFATE-K SULFATE-MG SULF 17.5-3.13-1.6 GM/177ML PO SOLN: 1.0000 | Freq: Once | ORAL | 0 refills | Status: AC

## 2015-12-20 MED ORDER — LEVOTHYROXINE SODIUM 75 MCG PO TABS: 75.0000 ug | ORAL_TABLET | Freq: Every day | ORAL | 1 refills | Status: DC

## 2015-12-20 MED FILL — SUPREP BOWEL PREP KIT: 17.5-3.13-1 | 5 days supply | Qty: 354 | Fill #0

## 2015-12-20 NOTE — Telephone Encounter (Signed)
Rx for Synthroid has been sent to the pharmacy.

## 2015-12-20 NOTE — Progress Notes (Signed)
No allergies to eggs or soy. No problems with anesthesia.  Pt given Emmi instructions for colonoscopy  No oxygen use  No diet drug use  

## 2015-12-25 ENCOUNTER — Encounter: Payer: Self-pay | Admitting: Internal Medicine

## 2016-01-03 ENCOUNTER — Ambulatory Visit (AMBULATORY_SURGERY_CENTER): Payer: BLUE CROSS/BLUE SHIELD | Admitting: Internal Medicine

## 2016-01-03 ENCOUNTER — Encounter: Payer: Self-pay | Admitting: Internal Medicine

## 2016-01-03 VITALS — BP 115/71 | HR 67 | Temp 97.1°F | Resp 12 | Ht 77.5 in | Wt 271.0 lb

## 2016-01-03 DIAGNOSIS — Z1211 Encounter for screening for malignant neoplasm of colon: Secondary | ICD-10-CM

## 2016-01-03 DIAGNOSIS — Z1212 Encounter for screening for malignant neoplasm of rectum: Secondary | ICD-10-CM

## 2016-01-03 DIAGNOSIS — D122 Benign neoplasm of ascending colon: Secondary | ICD-10-CM | POA: Diagnosis not present

## 2016-01-03 MED ORDER — SODIUM CHLORIDE 0.9 % IV SOLN: 500.0000 mL | INTRAVENOUS | Status: DC

## 2016-01-03 NOTE — Progress Notes (Signed)
Patient presented in admitting with an irregular heartrate.  Placed on monitor and the strip was shown to Rebeca Alert, CRNA.  She texted a picture of the strip to Osvaldo Angst who is the CRNA for the patient's case.  Patient is asymptomatic with no history of heart disease.

## 2016-01-03 NOTE — Progress Notes (Signed)
Called to room to assist during endoscopic procedure.  Patient ID and intended procedure confirmed with present staff. Received instructions for my participation in the procedure from the performing physician.  

## 2016-01-03 NOTE — Op Note (Signed)
Carlos Mccarty Patient Name: Carlos Mccarty Procedure Date: 01/03/2016 1:24 PM MRN: [id number] Endoscopist: Jerene Bears , MD Age: 55 Referring MD:  Date of Birth: Aug 11, 1960 Gender: Male Account #: [id number] Procedure:                Colonoscopy Indications:              Screening for colorectal malignant neoplasm, This                            is the patient's first colonoscopy Medicines:                Monitored Anesthesia Care Procedure:                Pre-Anesthesia Assessment:                           - Prior to the procedure, a History and Physical                            was performed, and patient medications and                            allergies were reviewed. The patient's tolerance of                            previous anesthesia was also reviewed. The risks                            and benefits of the procedure and the sedation                            options and risks were discussed with the patient.                            All questions were answered, and informed consent                            was obtained. Prior Anticoagulants: The patient has                            taken no previous anticoagulant or antiplatelet                            agents. ASA Grade Assessment: II - A patient with                            mild systemic disease. After reviewing the risks                            and benefits, the patient was deemed in                            satisfactory condition to undergo the procedure.  After obtaining informed consent, the colonoscope                            was passed under direct vision. Throughout the                            procedure, the patient's blood pressure, pulse, and                            oxygen saturations were monitored continuously. The                            Model CF-HQ190L [id number]) scope was introduced                            through the anus and advanced  to the the cecum,                            identified by appendiceal orifice and ileocecal                            valve. The colonoscopy was performed without                            difficulty. The patient tolerated the procedure                            well. The quality of the bowel preparation was                            good. The ileocecal valve, appendiceal orifice, and                            rectum were photographed. Scope In: 1:42:46 PM Scope Out: 1:57:44 PM Scope Withdrawal Time: 0 hours 11 minutes 44 seconds  Total Procedure Duration: 0 hours 14 minutes 58 seconds  Findings:                 The perianal and digital rectal examinations were                            normal.                           A 5 mm polyp was found in the ascending colon. The                            polyp was sessile. The polyp was removed with a                            cold snare. Resection and retrieval were complete.                           Internal hemorrhoids were found during  retroflexion. The hemorrhoids were small.                           The exam was otherwise without abnormality. Complications:            No immediate complications. Estimated Blood Loss:     Estimated blood loss: none. Impression:               - One 5 mm polyp in the ascending colon, removed                            with a cold snare. Resected and retrieved.                           - Internal hemorrhoids.                           - The examination was otherwise normal. Recommendation:           - Patient has a contact number available for                            emergencies. The signs and symptoms of potential                            delayed complications were discussed with the                            patient. Return to normal activities tomorrow.                            Written discharge instructions were provided to the                             patient.                           - Resume previous diet.                           - Continue present medications.                           - Await pathology results.                           - Repeat colonoscopy is recommended. The                            colonoscopy date will be determined after pathology                            results from today's exam become available for                            review. Jerene Bears, MD 01/03/2016 2:01:50 PM This report has been signed electronically.

## 2016-01-03 NOTE — Patient Instructions (Signed)
YOU HAD AN ENDOSCOPIC PROCEDURE TODAY AT THE [facility]:   Refer to the procedure report that was given to you for any specific questions about what was found during the examination.  If the procedure report does not answer your questions, please call your gastroenterologist to clarify.  If you requested that your care partner not be given the details of your procedure findings, then the procedure report has been included in a sealed envelope for you to review at your convenience later.  YOU SHOULD EXPECT: Some feelings of bloating in the abdomen. Passage of more gas than usual.  Walking can help get rid of the air that was put into your GI tract during the procedure and reduce the bloating. If you had a lower endoscopy (such as a colonoscopy or flexible sigmoidoscopy) you may notice spotting of blood in your stool or on the toilet paper. If you underwent a bowel prep for your procedure, you may not have a normal bowel movement for a few days.  Please Note:  You might notice some irritation and congestion in your nose or some drainage.  This is from the oxygen used during your procedure.  There is no need for concern and it should clear up in a day or so.  SYMPTOMS TO REPORT IMMEDIATELY:   Following lower endoscopy (colonoscopy or flexible sigmoidoscopy):  Excessive amounts of blood in the stool  Significant tenderness or worsening of abdominal pains  Swelling of the abdomen that is new, acute  Fever of 100F or higher    For urgent or emergent issues, a gastroenterologist can be reached at any hour by calling [phone].   DIET:  We do recommend a small meal at first, but then you may proceed to your regular diet.  Drink plenty of fluids but you should avoid alcoholic beverages for 24 hours.  ACTIVITY:  You should plan to take it easy for the rest of today and you should NOT DRIVE or use heavy machinery until tomorrow (because of the sedation medicines used during the test).     FOLLOW UP: Our staff will call the number listed on your records the next business day following your procedure to check on you and address any questions or concerns that you may have regarding the information given to you following your procedure. If we do not reach you, we will leave a message.  However, if you are feeling well and you are not experiencing any problems, there is no need to return our call.  We will assume that you have returned to your regular daily activities without incident.  If any biopsies were taken you will be contacted by phone or by letter within the next 1-3 weeks.  Please call us at [phone] if you have not heard about the biopsies in 3 weeks.    SIGNATURES/CONFIDENTIALITY: You and/or your care partner have signed paperwork which will be entered into your electronic medical record.  These signatures attest to the fact that that the information above on your After Visit Summary has been reviewed and is understood.  Full responsibility of the confidentiality of this discharge information lies with you and/or your care-partner.    Information on polyps & hemorrhoids  given to you today 

## 2016-01-03 NOTE — Progress Notes (Signed)
A/ox3 pleased with MAC, report to Penny RN 

## 2016-01-06 ENCOUNTER — Telehealth: Payer: Self-pay | Admitting: *Deleted

## 2016-01-06 ENCOUNTER — Telehealth: Payer: Self-pay

## 2016-01-06 NOTE — Telephone Encounter (Signed)
  Follow up Call-  Call back number 01/03/2016  Post procedure Call Back phone  # [phone]  Permission to leave phone message Yes  Some recent data might be hidden    Patient was called for follow up after his procedure on 01/03/2016. No answer at the number given for follow up phone call. A message was left on the answering machine.

## 2016-01-06 NOTE — Telephone Encounter (Signed)
No answer, message left for the patient. 

## 2016-01-10 ENCOUNTER — Encounter: Payer: Self-pay | Admitting: Internal Medicine

## 2016-02-20 MED FILL — LEVOTHYROXINE 75 MCG TABLET: 75 | 90 days supply | Qty: 90 | Fill #0

## 2016-07-26 ENCOUNTER — Encounter (HOSPITAL_BASED_OUTPATIENT_CLINIC_OR_DEPARTMENT_OTHER): Payer: Self-pay | Admitting: *Deleted

## 2016-07-26 ENCOUNTER — Emergency Department (HOSPITAL_BASED_OUTPATIENT_CLINIC_OR_DEPARTMENT_OTHER): Payer: BLUE CROSS/BLUE SHIELD

## 2016-07-26 ENCOUNTER — Emergency Department (HOSPITAL_BASED_OUTPATIENT_CLINIC_OR_DEPARTMENT_OTHER)
Admission: EM | Admit: 2016-07-26 | Discharge: 2016-07-26 | Disposition: A | Discharge status: Home / Self Care | Payer: BLUE CROSS/BLUE SHIELD | Attending: Emergency Medicine | Admitting: Emergency Medicine

## 2016-07-26 DIAGNOSIS — S298XXA Other specified injuries of thorax, initial encounter: Secondary | ICD-10-CM | POA: Diagnosis not present

## 2016-07-26 DIAGNOSIS — Y9389 Activity, other specified: Secondary | ICD-10-CM | POA: Diagnosis not present

## 2016-07-26 DIAGNOSIS — S299XXA Unspecified injury of thorax, initial encounter: Secondary | ICD-10-CM | POA: Diagnosis present

## 2016-07-26 DIAGNOSIS — E039 Hypothyroidism, unspecified: Secondary | ICD-10-CM | POA: Diagnosis not present

## 2016-07-26 DIAGNOSIS — Z79899 Other long term (current) drug therapy: Secondary | ICD-10-CM | POA: Insufficient documentation

## 2016-07-26 DIAGNOSIS — Y929 Unspecified place or not applicable: Secondary | ICD-10-CM | POA: Diagnosis not present

## 2016-07-26 DIAGNOSIS — Y999 Unspecified external cause status: Secondary | ICD-10-CM | POA: Diagnosis not present

## 2016-07-26 DIAGNOSIS — S50812A Abrasion of left forearm, initial encounter: Secondary | ICD-10-CM | POA: Insufficient documentation

## 2016-07-26 MED ORDER — HYDROCODONE-ACETAMINOPHEN 5-325 MG PO TABS
1.0000 | ORAL_TABLET | Freq: Once | ORAL | Status: AC
  Administered 2016-07-26: 1 via ORAL
  Filled 2016-07-26: qty 1

## 2016-07-26 MED ORDER — HYDROCODONE-ACETAMINOPHEN 5-325 MG PO TABS
ORAL_TABLET | ORAL | Status: AC
  Filled 2016-07-26: qty 1

## 2016-07-26 MED ORDER — HYDROCODONE-ACETAMINOPHEN 5-325 MG PO TABS
2.0000 | ORAL_TABLET | Freq: Once | ORAL | Status: DC
  Filled 2016-07-26: qty 2

## 2016-07-26 MED ORDER — HYDROCODONE-ACETAMINOPHEN 5-325 MG PO TABS: 1.0000 | ORAL_TABLET | Freq: Four times a day (QID) | ORAL | 0 refills | Status: DC | PRN

## 2016-07-26 NOTE — ED Notes (Signed)
Pt has a ride home.  
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
## 2016-07-26 NOTE — ED Notes (Signed)
Pt in xray
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
## 2016-07-26 NOTE — ED Notes (Signed)
Returned from xray

## 2016-11-13 ENCOUNTER — Other Ambulatory Visit: Payer: Self-pay

## 2016-11-20 MED FILL — LEVOTHYROXINE 75 MCG TABLET: 75 | 30 days supply | Qty: 30 | Fill #1

## 2016-12-18 ENCOUNTER — Other Ambulatory Visit: Payer: Self-pay

## 2017-01-01 ENCOUNTER — Ambulatory Visit (INDEPENDENT_AMBULATORY_CARE_PROVIDER_SITE_OTHER): Payer: BLUE CROSS/BLUE SHIELD | Admitting: Medical

## 2017-01-01 ENCOUNTER — Encounter: Payer: Self-pay | Admitting: Medical

## 2017-01-01 VITALS — BP 123/71 | HR 78 | Temp 98.1°F | Resp 16 | Wt 281.6 lb

## 2017-01-01 DIAGNOSIS — Z23 Encounter for immunization: Secondary | ICD-10-CM | POA: Diagnosis not present

## 2017-01-01 DIAGNOSIS — Z125 Encounter for screening for malignant neoplasm of prostate: Secondary | ICD-10-CM

## 2017-01-01 DIAGNOSIS — Z0000 Encounter for general adult medical examination without abnormal findings: Secondary | ICD-10-CM | POA: Diagnosis not present

## 2017-01-01 LAB — COMPREHENSIVE METABOLIC PANEL
ALT: 38 U/L (ref 0–53)
AST: 34 U/L (ref 0–37)
Albumin: 4.2 g/dL (ref 3.5–5.2)
Alkaline Phosphatase: 68 U/L (ref 39–117)
BUN: 15 mg/dL (ref 6–23)
CALCIUM: 9.2 mg/dL (ref 8.4–10.5)
CHLORIDE: 105 meq/L (ref 96–112)
CO2: 32 meq/L (ref 19–32)
Creatinine, Ser: 1.57 mg/dL — ABNORMAL HIGH (ref 0.40–1.50)
GFR: 58.71 mL/min — AB (ref >60.00)
GLUCOSE: 81 mg/dL (ref 70–99)
Potassium: 4.8 mEq/L (ref 3.5–5.1)
Sodium: 141 mEq/L (ref 135–145)
Total Bilirubin: 0.6 mg/dL (ref 0.2–1.2)
Total Protein: 7.2 g/dL (ref 6.0–8.3)

## 2017-01-01 LAB — LIPID PANEL
CHOL/HDL RATIO: 4
Cholesterol: 175 mg/dL (ref 0–200)
HDL: 46.2 mg/dL (ref >39.00)
LDL CALC: 104 mg/dL — AB (ref 0–99)
NONHDL: 129.2
Triglycerides: 126 mg/dL (ref 0.0–149.0)
VLDL: 25.2 mg/dL (ref 0.0–40.0)

## 2017-01-01 LAB — POC URINALSYSI DIPSTICK (AUTOMATED)
BILIRUBIN UA: NEGATIVE
GLUCOSE UA: NEGATIVE
Ketones, UA: NEGATIVE
Leukocytes, UA: NEGATIVE
Nitrite, UA: NEGATIVE
PH UA: 6 (ref 5.0–8.0)
Protein, UA: NEGATIVE
RBC UA: NEGATIVE
SPEC GRAV UA: 1.025 (ref 1.010–1.025)
Urobilinogen, UA: NEGATIVE E.U./dL — AB

## 2017-01-01 LAB — CBC
HCT: 45.3 % (ref 39.0–52.0)
Hemoglobin: 14.9 g/dL (ref 13.0–17.0)
MCHC: 33 g/dL (ref 30.0–36.0)
MCV: 85.1 fl (ref 78.0–100.0)
PLATELETS: 210 10*3/uL (ref 150.0–400.0)
RBC: 5.32 Mil/uL (ref 4.22–5.81)
RDW: 14.3 % (ref 11.5–15.5)
WBC: 5.9 10*3/uL (ref 4.0–10.5)

## 2017-01-01 LAB — PSA: PSA: 1.04 ng/mL (ref 0.10–4.00)

## 2017-01-01 MED ORDER — LEVOTHYROXINE SODIUM 75 MCG PO TABS: 75.0000 ug | ORAL_TABLET | Freq: Every day | ORAL | 1 refills | Status: DC

## 2017-01-01 MED FILL — LEVOTHYROXINE 75 MCG TABLET: 75 | 30 days supply | Qty: 30 | Fill #0

## 2017-01-01 NOTE — Patient Instructions (Addendum)
For you wellness exam today I have ordered cbc, cmp, lipid panel, psa and ua.  Vaccine given today.(flu vaccine)  Recommend exercise and healthy diet.  We will let you know lab results as they come in.  Follow up date appointment will be determined after lab review.   I do think you have small oblong lipoma. If area enlarges will refer to general surgeon.  Follow up date to be determined after lab review.(but remember do want to check your thyroid labs in 3 months. Refilling thyroid med today.)   Preventive Care 40-64 Years, Male Preventive care refers to lifestyle choices and visits with your health care provider that can promote health and wellness. What does preventive care include?  A yearly physical exam. This is also called an annual well check.  Dental exams once or twice a year.  Routine eye exams. Ask your health care provider how often you should have your eyes checked.  Personal lifestyle choices, including: ? Daily care of your teeth and gums. ? Regular physical activity. ? Eating a healthy diet. ? Avoiding tobacco and drug use. ? Limiting alcohol use. ? Practicing safe sex. ? Taking low-dose aspirin every day starting at age 38. What happens during an annual well check? The services and screenings done by your health care provider during your annual well check will depend on your age, overall health, lifestyle risk factors, and family history of disease. Counseling Your health care provider may ask you questions about your:  Alcohol use.  Tobacco use.  Drug use.  Emotional well-being.  Home and relationship well-being.  Sexual activity.  Eating habits.  Work and work Statistician.  Screening You may have the following tests or measurements:  Height, weight, and BMI.  Blood pressure.  Lipid and cholesterol levels. These may be checked every 5 years, or more frequently if you are over 110 years old.  Skin check.  Lung cancer screening. You may  have this screening every year starting at age 80 if you have a 30-pack-year history of smoking and currently smoke or have quit within the past 15 years.  Fecal occult blood test (FOBT) of the stool. You may have this test every year starting at age 39.  Flexible sigmoidoscopy or colonoscopy. You may have a sigmoidoscopy every 5 years or a colonoscopy every 10 years starting at age 11.  Prostate cancer screening. Recommendations will vary depending on your family history and other risks.  Hepatitis C blood test.  Hepatitis B blood test.  Sexually transmitted disease (STD) testing.  Diabetes screening. This is done by checking your blood sugar (glucose) after you have not eaten for a while (fasting). You may have this done every 1-3 years.  Discuss your test results, treatment options, and if necessary, the need for more tests with your health care provider. Vaccines Your health care provider may recommend certain vaccines, such as:  Influenza vaccine. This is recommended every year.  Tetanus, diphtheria, and acellular pertussis (Tdap, Td) vaccine. You may need a Td booster every 10 years.  Varicella vaccine. You may need this if you have not been vaccinated.  Zoster vaccine. You may need this after age 86.  Measles, mumps, and rubella (MMR) vaccine. You may need at least one dose of MMR if you were born in 1957 or later. You may also need a second dose.  Pneumococcal 13-valent conjugate (PCV13) vaccine. You may need this if you have certain conditions and have not been vaccinated.  Pneumococcal polysaccharide (PPSV23) vaccine.  You may need one or two doses if you smoke cigarettes or if you have certain conditions.  Meningococcal vaccine. You may need this if you have certain conditions.  Hepatitis A vaccine. You may need this if you have certain conditions or if you travel or work in places where you may be exposed to hepatitis A.  Hepatitis B vaccine. You may need this if you  have certain conditions or if you travel or work in places where you may be exposed to hepatitis B.  Haemophilus influenzae type b (Hib) vaccine. You may need this if you have certain risk factors.  Talk to your health care provider about which screenings and vaccines you need and how often you need them. This information is not intended to replace advice given to you by your health care provider. Make sure you discuss any questions you have with your health care provider. Document Released: 02/01/2015 Document Revised: 09/25/2015 Document Reviewed: 11/06/2014 Elsevier Interactive Patient Education  2017 [employer].

## 2017-01-01 NOTE — Progress Notes (Signed)
Subjective:    Patient ID: Carlos Majors., male    DOB: Oct 20, 1960, 56 y.o.   MRN: [id number]  HPI  Pt in for CPE.  Pt did get new job since I last saw. [place]. Pt not been exercising. Pt has gained some weight since last visit. Admits diet has not been great. Pt also drinks 2 cokes a day. Non smoker, drinks beer when watching football on weekends.   Pt has not had flu vaccine. Will get today.  Up to date on tetanus and  Colonoscopy.   Review of Systems  Constitutional: Negative for chills, fatigue and fever.  HENT: Negative for congestion, ear discharge and ear pain.   Respiratory: Negative for cough, chest tightness, shortness of breath and wheezing.   Cardiovascular: Negative for chest pain and palpitations.  Gastrointestinal: Negative for abdominal distention, abdominal pain, constipation, diarrhea, nausea and vomiting.  Genitourinary: Negative for decreased urine volume, difficulty urinating, dysuria, flank pain, hematuria, penile swelling and urgency.  Musculoskeletal: Negative for back pain, joint swelling and neck pain.  Skin: Negative for rash.       Mild lump he felt on his left side back. Lower back. (Dad had similar findings) Likely lipomas.  Neurological: Negative for dizziness, syncope, weakness, numbness and headaches.  Hematological: Negative for adenopathy. Does not bruise/bleed easily.  Psychiatric/Behavioral: Negative for behavioral problems and confusion. The patient is not nervous/anxious and is not hyperactive.     Past Medical History:  Diagnosis Date  . Thyroid disease    hypothyroidism     Social History   Socioeconomic History  . Marital status: Single    Spouse name: Not on file  . Number of children: Not on file  . Years of education: Not on file  . Highest education level: Not on file  Social Needs  . Financial resource strain: Not on file  . Food insecurity - worry: Not on file  . Food insecurity - inability: Not on file  .  Transportation needs - medical: Not on file  . Transportation needs - non-medical: Not on file  Occupational History  . Not on file  Tobacco Use  . Smoking status: Never Smoker  . Smokeless tobacco: Never Used  Substance and Sexual Activity  . Alcohol use: Yes    Alcohol/week: 1.8 oz    Types: 3 Cans of beer per week  . Drug use: No  . Sexual activity: Yes  Other Topics Concern  . Not on file  Social History Narrative  . Not on file    Past Surgical History:  Procedure Laterality Date  . ARTHROSCOPIC REPAIR ACL Left 2000  . FINGER AMPUTATION  2015   3rd finger left hand  . MENISCUS REPAIR Right 1990  . NAILBED REPAIR Left 04/16/2015   Procedure: nailbed excision left middle finger;  Surgeon: Hessie Knows, MD;  Location: [facility] ORS;  Service: Orthopedics;  Laterality: Left;  . THUMB ARTHROSCOPY Left 2014  . WRIST FUSION Right 2000    Family History  Problem Relation Age of Onset  . Dementia Mother   . Hypertension Father   . Colon cancer Neg Hx     No Known Allergies  Current Outpatient Medications on File Prior to Visit  Medication Sig Dispense Refill  . HYDROcodone-acetaminophen (NORCO) 5-325 MG tablet Take 1-2 tablets by mouth every 6 (six) hours as needed (for rib pain; may cause constipation). 30 tablet 0  . levothyroxine (SYNTHROID, LEVOTHROID) 75 MCG tablet Take 1 tablet (75 mcg total)  by mouth daily. 90 tablet 1   Current Facility-Administered Medications on File Prior to Visit  Medication Dose Route Frequency Provider Last Rate Last Dose  . 0.9 %  sodium chloride infusion  500 mL Intravenous Continuous Pyrtle, Lajuan Lines, MD        BP 123/71   Pulse 78   Temp 98.1 F (36.7 C) (Oral)   Resp 16   Wt 281 lb 9.6 oz (127.7 kg)   SpO2 100%   BMI 33.39 kg/m       Objective:   Physical Exam  General Mental Status- Alert. General Appearance- Not in acute distress.   Skin General: Color- Normal Color. Moisture- Normal Moisture.  Neck Carotid Arteries-  Normal color. Moisture- Normal Moisture. No carotid bruits. No JVD.  Chest and Lung Exam Auscultation: Breath Sounds:-Normal.  Cardiovascular Auscultation:Rythm- Regular. Murmurs & Other Heart Sounds:Auscultation of the heart reveals- No Murmurs.  Abdomen Inspection:-Inspeection Normal. Palpation/Percussion:Note:No mass. Palpation and Percussion of the abdomen reveal- Non Tender, Non Distended + BS, no rebound or guarding.   Neurologic Cranial Nerve exam:- CN III-XII intact(No nystagmus), symmetric smile. Drift Test:- No drift. Strength:- 5/5 equal and symmetric strength both upper and lower extremities.  Rectal- deferred. Genital- deferred.  Back- left side lower thoracic area. Possible oblong shallow lipoma.(about 2 cm length).        Assessment & Plan:  For you wellness exam today I have ordered cbc, cmp, lipid panel, psa and ua.  Vaccine given today.(flu vaccine)  Recommend exercise and healthy diet.  We will let you know lab results as they come in.  Follow up date appointment will be determined after lab review.   I do think you have small oblong lipoma. If area enlarges will refer to general surgeon.  Follow up date to be determined after lab review.(but remember do want to check your thyroid labs in 3 months. Refilling thyroid med today.)

## 2017-01-28 DIAGNOSIS — H40023 Open angle with borderline findings, high risk, bilateral: Secondary | ICD-10-CM | POA: Diagnosis not present

## 2017-02-15 MED FILL — LEVOTHYROXINE 75 MCG TABLET: 75 | 30 days supply | Qty: 30 | Fill #1

## 2017-04-08 MED FILL — LEVOTHYROXINE 75 MCG TABLET: 75 | 30 days supply | Qty: 30 | Fill #2

## 2017-06-30 ENCOUNTER — Ambulatory Visit: Payer: Self-pay | Admitting: Medical

## 2017-07-05 MED FILL — LEVOTHYROXINE 75 MCG TABLET: 75 | 30 days supply | Qty: 30 | Fill #3

## 2017-08-05 MED FILL — LEVOTHYROXINE 75 MCG TABLET: 75 | 30 days supply | Qty: 30 | Fill #4

## 2017-09-06 MED FILL — LEVOTHYROXINE 75 MCG TABLET: 75 | 30 days supply | Qty: 30 | Fill #5

## 2017-10-08 ENCOUNTER — Other Ambulatory Visit: Payer: Self-pay | Admitting: Medical

## 2017-10-11 MED FILL — LEVOTHYROXINE 75 MCG TABLET: 75 | 30 days supply | Qty: 30 | Fill #0

## 2017-10-11 NOTE — Telephone Encounter (Signed)
LVM to inform pt rx was sent to pharmacy and that pt is needing to schedule a fu appt with provider.

## 2017-10-11 NOTE — Telephone Encounter (Signed)
Pt due for follow up please call and schedule appointment.  

## 2017-11-02 ENCOUNTER — Telehealth: Payer: Self-pay

## 2017-11-02 ENCOUNTER — Encounter: Payer: Self-pay | Admitting: Medical

## 2017-11-02 ENCOUNTER — Ambulatory Visit (INDEPENDENT_AMBULATORY_CARE_PROVIDER_SITE_OTHER): Payer: 59 | Admitting: Medical

## 2017-11-02 ENCOUNTER — Other Ambulatory Visit: Payer: Self-pay | Admitting: Medical

## 2017-11-02 VITALS — BP 130/72 | HR 71 | Temp 98.2°F | Resp 16 | Ht 77.0 in | Wt 268.0 lb

## 2017-11-02 DIAGNOSIS — Z0000 Encounter for general adult medical examination without abnormal findings: Secondary | ICD-10-CM | POA: Diagnosis not present

## 2017-11-02 DIAGNOSIS — E039 Hypothyroidism, unspecified: Secondary | ICD-10-CM | POA: Diagnosis not present

## 2017-11-02 DIAGNOSIS — Z23 Encounter for immunization: Secondary | ICD-10-CM | POA: Diagnosis not present

## 2017-11-02 DIAGNOSIS — N529 Male erectile dysfunction, unspecified: Secondary | ICD-10-CM | POA: Diagnosis not present

## 2017-11-02 DIAGNOSIS — K4090 Unilateral inguinal hernia, without obstruction or gangrene, not specified as recurrent: Secondary | ICD-10-CM

## 2017-11-02 DIAGNOSIS — D179 Benign lipomatous neoplasm, unspecified: Secondary | ICD-10-CM | POA: Diagnosis not present

## 2017-11-02 DIAGNOSIS — Z125 Encounter for screening for malignant neoplasm of prostate: Secondary | ICD-10-CM

## 2017-11-02 LAB — COMPREHENSIVE METABOLIC PANEL
ALT: 31 U/L (ref 0–53)
AST: 25 U/L (ref 0–37)
Albumin: 4.4 g/dL (ref 3.5–5.2)
Alkaline Phosphatase: 72 U/L (ref 39–117)
BILIRUBIN TOTAL: 0.5 mg/dL (ref 0.2–1.2)
BUN: 16 mg/dL (ref 6–23)
CO2: 29 meq/L (ref 19–32)
CREATININE: 1.37 mg/dL (ref 0.40–1.50)
Calcium: 9.8 mg/dL (ref 8.4–10.5)
Chloride: 105 mEq/L (ref 96–112)
GFR: 68.5 mL/min (ref >60.00)
GLUCOSE: 94 mg/dL (ref 70–99)
Potassium: 3.9 mEq/L (ref 3.5–5.1)
SODIUM: 141 meq/L (ref 135–145)
Total Protein: 7.4 g/dL (ref 6.0–8.3)

## 2017-11-02 LAB — POC URINALSYSI DIPSTICK (AUTOMATED)
BILIRUBIN UA: NEGATIVE
Blood, UA: NEGATIVE
Glucose, UA: NEGATIVE
Ketones, UA: NEGATIVE
LEUKOCYTES UA: NEGATIVE
NITRITE UA: NEGATIVE
PROTEIN UA: NEGATIVE
Spec Grav, UA: >=1.03 — AB (ref 1.010–1.025)
Urobilinogen, UA: NEGATIVE E.U./dL — AB
pH, UA: 6 (ref 5.0–8.0)

## 2017-11-02 LAB — CBC
HCT: 43.6 % (ref 39.0–52.0)
HEMOGLOBIN: 14.5 g/dL (ref 13.0–17.0)
MCHC: 33.3 g/dL (ref 30.0–36.0)
MCV: 82.3 fl (ref 78.0–100.0)
PLATELETS: 221 10*3/uL (ref 150.0–400.0)
RBC: 5.3 Mil/uL (ref 4.22–5.81)
RDW: 14.2 % (ref 11.5–15.5)
WBC: 6.3 10*3/uL (ref 4.0–10.5)

## 2017-11-02 LAB — LIPID PANEL
CHOL/HDL RATIO: 3
Cholesterol: 170 mg/dL (ref 0–200)
HDL: 49.1 mg/dL (ref >39.00)
LDL CALC: 97 mg/dL (ref 0–99)
NONHDL: 121.02
Triglycerides: 121 mg/dL (ref 0.0–149.0)
VLDL: 24.2 mg/dL (ref 0.0–40.0)

## 2017-11-02 LAB — PSA: PSA: 1.32 ng/mL (ref 0.10–4.00)

## 2017-11-02 LAB — TSH: TSH: 14.05 u[IU]/mL — AB (ref 0.35–4.50)

## 2017-11-02 MED ORDER — SILDENAFIL CITRATE 100 MG PO TABS: ORAL_TABLET | ORAL | 0 refills | Status: DC

## 2017-11-02 MED ORDER — LEVOTHYROXINE SODIUM 75 MCG PO TABS: 75.0000 ug | ORAL_TABLET | Freq: Every day | ORAL | 0 refills | Status: DC

## 2017-11-02 MED FILL — SILDENAFIL CITRATE 100 MG T: 100 | 90 days supply | Qty: 9 | Fill #0

## 2017-11-02 NOTE — Telephone Encounter (Unsigned)
Copied from [place] [id number]. Topic: Quick Communication - Rx Refill/Question >> Nov 02, 2017  9:59 AM Yvette Rack wrote: Medication: levothyroxine (SYNTHROID, LEVOTHROID) 75 MCG tablet  Has the patient contacted their pharmacy? no  Preferred Pharmacy (with phone number or street name): [place], [place] - [place] [phone] (Phone) [phone] (Fax)  Agent: Please be advised that RX refills may take up to 3 business days. We ask that you follow-up with your pharmacy.

## 2017-11-02 NOTE — Telephone Encounter (Signed)
Copied from [place] [phone]. Topic: General - Other >> Nov 02, 2017 10:04 AM Yvette Rack wrote: Reason for CRM: Pt states he would like to have the Rx for levothyroxine (SYNTHROID, LEVOTHROID) 75 MCG tablet as a 12 month prescription transferred to [place].

## 2017-11-02 NOTE — Telephone Encounter (Signed)
Will send in that prescription when I get thyroid level back. Would you mind going ahead and adding that particular pharmacy to his list of pharmacy he uses.

## 2017-11-02 NOTE — Progress Notes (Signed)
Subjective:    Patient ID: Carlos Majors., male    DOB: 1960-09-06, 57 y.o.   MRN: [id number]  HPI   Pt in for CPE.  Pt did get new job since I last saw. [employer]. Pt not been exercising. Pt has gained some weight since last visit. Admits diet has not been great. Pt also drinks 2 cokes a day. Non smoker, drinks beer when watching football on weekends.   Pt has not had flu vaccine. Will get today.  Up to date on tetanus and  Colonoscopy.  On review pt weight is back to historical range. At one point he gained p to 281 and he wanted to loose weight. He is eating less now  and has better work schedule.    Review of Systems  Constitutional: Negative for activity change, chills, diaphoresis, fatigue and fever.  HENT: Negative for congestion, ear pain, postnasal drip, sinus pressure and sinus pain.   Respiratory: Negative for cough, chest tightness and shortness of breath.   Cardiovascular: Negative for chest pain, palpitations and leg swelling.  Gastrointestinal: Negative for abdominal pain, nausea and vomiting.  Genitourinary: Negative for dysuria, flank pain, frequency, hematuria and urgency.       Pt states he is having some ED. This has been going on for past 3-4 months.  Pt states has used viagra in the past. With high dose he felt like he could not even ejaculate. At lower dose he said worked fine.  Pt has has left groin area moderate bulge. It has started to enlarge over4 years or more. No pain.   Musculoskeletal: Negative for back pain, neck pain and neck stiffness.  Skin: Negative for rash.  Neurological: Negative for dizziness, weakness, light-headedness and headaches.  Psychiatric/Behavioral: Negative for agitation, behavioral problems and confusion. The patient is not nervous/anxious.      Past Medical History:  Diagnosis Date  . Thyroid disease    hypothyroidism     Social History   Socioeconomic History  . Marital status: Single    Spouse name: Not  on file  . Number of children: Not on file  . Years of education: Not on file  . Highest education level: Not on file  Occupational History  . Not on file  Social Needs  . Financial resource strain: Not on file  . Food insecurity:    Worry: Not on file    Inability: Not on file  . Transportation needs:    Medical: Not on file    Non-medical: Not on file  Tobacco Use  . Smoking status: Never Smoker  . Smokeless tobacco: Never Used  Substance and Sexual Activity  . Alcohol use: Yes    Alcohol/week: 3.0 standard drinks    Types: 3 Cans of beer per week  . Drug use: No  . Sexual activity: Yes  Lifestyle  . Physical activity:    Days per week: Not on file    Minutes per session: Not on file  . Stress: Not on file  Relationships  . Social connections:    Talks on phone: Not on file    Gets together: Not on file    Attends religious service: Not on file    Active member of club or organization: Not on file    Attends meetings of clubs or organizations: Not on file    Relationship status: Not on file  . Intimate partner violence:    Fear of current or ex partner: Not on file  Emotionally abused: Not on file    Physically abused: Not on file    Forced sexual activity: Not on file  Other Topics Concern  . Not on file  Social History Narrative  . Not on file    Past Surgical History:  Procedure Laterality Date  . ARTHROSCOPIC REPAIR ACL Left 2000  . FINGER AMPUTATION  2015   3rd finger left hand  . MENISCUS REPAIR Right 1990  . NAILBED REPAIR Left 04/16/2015   Procedure: nailbed excision left middle finger;  Surgeon: Hessie Knows, MD;  Location: [facility] ORS;  Service: Orthopedics;  Laterality: Left;  . THUMB ARTHROSCOPY Left 2014  . WRIST FUSION Right 2000    Family History  Problem Relation Age of Onset  . Dementia Mother   . Hypertension Father   . Colon cancer Neg Hx     No Known Allergies  Current Outpatient Medications on File Prior to Visit  Medication  Sig Dispense Refill  . levothyroxine (SYNTHROID, LEVOTHROID) 75 MCG tablet TAKE 1 TABLET BY MOUTH DAILY 30 tablet 0   Current Facility-Administered Medications on File Prior to Visit  Medication Dose Route Frequency Provider Last Rate Last Dose  . 0.9 %  sodium chloride infusion  500 mL Intravenous Continuous Pyrtle, Lajuan Lines, MD        BP 130/72   Pulse 71   Temp 98.2 F (36.8 C) (Oral)   Resp 16   Ht 6\' 5"  (1.956 m)   Wt 268 lb (121.6 kg)   SpO2 98%   BMI 31.78 kg/m       Objective:   Physical Exam   General Mental Status- Alert. General Appearance- Not in acute distress.   Skin General: Color- Normal Color. Moisture- Normal Moisture.  Neck Carotid Arteries- Normal color. Moisture- Normal Moisture. No carotid bruits. No JVD.  Chest and Lung Exam Auscultation: Breath Sounds:-Normal.  Cardiovascular Auscultation:Rythm- Regular. Murmurs & Other Heart Sounds:Auscultation of the heart reveals- No Murmurs.  Abdomen Inspection:-Inspeection Normal. Palpation/Percussion:Note:No mass. Palpation and Percussion of the abdomen reveal- Non Tender, Non Distended + BS, no rebound or guarding. Left flank- feels to have 6-8 cm probable lipoma.     Neurologic Cranial Nerve exam:- CN III-XII intact(No nystagmus), symmetric smile. Strength:- 5/5 equal and symmetric strength both upper and lower extremities.   HEENT Head- Normal. Ear Auditory Canal - Left- Normal. Right - Normal.Tympanic Membrane- Left- Normal. Right- Normal. Eye Sclera/Conjunctiva- Left- Normal. Right- Normal. Nose & Sinuses Nasal Mucosa- Left-  Boggy and Congested. Right-  Boggy and  Congested.Bilateral no maxillary and no  frontal sinus pressure. Mouth & Throat Lips: Upper Lip- Normal: no dryness, cracking, pallor, cyanosis, or vesicular eruption. Lower Lip-Normal: no dryness, cracking, pallor, cyanosis or vesicular eruption. Buccal Mucosa- Bilateral- No Aphthous ulcers. Oropharynx- No Discharge or  Erythema. Tonsils: Characteristics- Bilateral- No Erythema or Congestion. Size/Enlargement-   Rectal- smooth, symmetric and felt normal in size. Genital-left side moderate inguinal hernia. Testicles normal. Lt side no hernia felt.        Assessment & Plan:  For you wellness exam today I have ordered cbc, cmp, lipid panel, psa and ua.  Vaccine given today flu.  Recommend exercise and healthy diet.  We will let you know lab results as they come in.  Follow up date appointment will be determined after lab review.   Mackie Pai, PA-C

## 2017-11-02 NOTE — Patient Instructions (Addendum)
For you wellness exam today I have ordered cbc, cmp, lipid panel, psa and ua.  Vaccine given today flu.  Recommend exercise and healthy diet.  We will let you know lab results as they come in.  Follow up date appointment will be determined after lab review.   For ED will rx viagra.  For probable lipoma left side abdomen will refer to surgeon to get their opinion. They may get [place]. Also will get surgeon opinion/evaluation of left inguinal hernia.    Preventive Care 40-64 Years, Male Preventive care refers to lifestyle choices and visits with your health care provider that can promote health and wellness. What does preventive care include?  A yearly physical exam. This is also called an annual well check.  Dental exams once or twice a year.  Routine eye exams. Ask your health care provider how often you should have your eyes checked.  Personal lifestyle choices, including: ? Daily care of your teeth and gums. ? Regular physical activity. ? Eating a healthy diet. ? Avoiding tobacco and drug use. ? Limiting alcohol use. ? Practicing safe sex. ? Taking low-dose aspirin every day starting at age 60. What happens during an annual well check? The services and screenings done by your health care provider during your annual well check will depend on your age, overall health, lifestyle risk factors, and family history of disease. Counseling Your health care provider may ask you questions about your:  Alcohol use.  Tobacco use.  Drug use.  Emotional well-being.  Home and relationship well-being.  Sexual activity.  Eating habits.  Work and work Statistician.  Screening You may have the following tests or measurements:  Height, weight, and BMI.  Blood pressure.  Lipid and cholesterol levels. These may be checked every 5 years, or more frequently if you are over 70 years old.  Skin check.  Lung cancer screening. You may have this screening every year starting at age  21 if you have a 30-pack-year history of smoking and currently smoke or have quit within the past 15 years.  Fecal occult blood test (FOBT) of the stool. You may have this test every year starting at age 69.  Flexible sigmoidoscopy or colonoscopy. You may have a sigmoidoscopy every 5 years or a colonoscopy every 10 years starting at age 34.  Prostate cancer screening. Recommendations will vary depending on your family history and other risks.  Hepatitis C blood test.  Hepatitis B blood test.  Sexually transmitted disease (STD) testing.  Diabetes screening. This is done by checking your blood sugar (glucose) after you have not eaten for a while (fasting). You may have this done every 1-3 years.  Discuss your test results, treatment options, and if necessary, the need for more tests with your health care provider. Vaccines Your health care provider may recommend certain vaccines, such as:  Influenza vaccine. This is recommended every year.  Tetanus, diphtheria, and acellular pertussis (Tdap, Td) vaccine. You may need a Td booster every 10 years.  Varicella vaccine. You may need this if you have not been vaccinated.  Zoster vaccine. You may need this after age 79.  Measles, mumps, and rubella (MMR) vaccine. You may need at least one dose of MMR if you were born in 1957 or later. You may also need a second dose.  Pneumococcal 13-valent conjugate (PCV13) vaccine. You may need this if you have certain conditions and have not been vaccinated.  Pneumococcal polysaccharide (PPSV23) vaccine. You may need one or two doses  if you smoke cigarettes or if you have certain conditions.  Meningococcal vaccine. You may need this if you have certain conditions.  Hepatitis A vaccine. You may need this if you have certain conditions or if you travel or work in places where you may be exposed to hepatitis A.  Hepatitis B vaccine. You may need this if you have certain conditions or if you travel or  work in places where you may be exposed to hepatitis B.  Haemophilus influenzae type b (Hib) vaccine. You may need this if you have certain risk factors.  Talk to your health care provider about which screenings and vaccines you need and how often you need them. This information is not intended to replace advice given to you by your health care provider. Make sure you discuss any questions you have with your health care provider. Document Released: 02/01/2015 Document Revised: 09/25/2015 Document Reviewed: 11/06/2014 Elsevier Interactive Patient Education  [employer].

## 2017-11-04 ENCOUNTER — Telehealth: Payer: Self-pay | Admitting: Medical

## 2017-11-04 MED ORDER — LEVOTHYROXINE SODIUM 100 MCG PO TABS: 100.0000 ug | ORAL_TABLET | Freq: Every day | ORAL | 0 refills | Status: DC

## 2017-11-04 MED ORDER — LEVOTHYROXINE SODIUM 100 MCG PO TABS: 100.0000 ug | ORAL_TABLET | Freq: Every day | ORAL | 3 refills | Status: DC

## 2017-11-04 MED FILL — LEVOTHYROXINE 100 MCG TAB: 100 | 30 days supply | Qty: 30 | Fill #0

## 2017-11-04 NOTE — Telephone Encounter (Signed)
Will you call down to [facility] and cancel 31mcg. I want him to be on 100 mcg daily.

## 2017-11-10 DIAGNOSIS — K4090 Unilateral inguinal hernia, without obstruction or gangrene, not specified as recurrent: Secondary | ICD-10-CM | POA: Diagnosis not present

## 2017-11-10 DIAGNOSIS — D171 Benign lipomatous neoplasm of skin and subcutaneous tissue of trunk: Secondary | ICD-10-CM | POA: Diagnosis not present

## 2018-01-27 ENCOUNTER — Other Ambulatory Visit: Payer: Self-pay

## 2018-01-27 ENCOUNTER — Telehealth: Payer: Self-pay | Admitting: Medical

## 2018-01-27 MED ORDER — SILDENAFIL CITRATE 100 MG PO TABS: ORAL_TABLET | ORAL | 0 refills | Status: DC

## 2018-01-27 MED FILL — SILDENAFIL CITRATE 100 MG T: 100 | 30 days supply | Qty: 4 | Fill #0

## 2018-01-27 MED FILL — LEVOTHYROXINE 75 MCG TABLET: 75 | 30 days supply | Qty: 30 | Fill #0

## 2018-01-27 NOTE — Progress Notes (Unsigned)
Pt requesting refill on Sildenafil. Please advise.

## 2018-01-27 NOTE — Telephone Encounter (Signed)
rx viagra sent to pt pharmacy.

## 2018-03-02 IMAGING — CR DG RIBS W/ CHEST 3+V*L*
4 series · 4 of 4 positions shown · non-contrast
Comparison: None.

CLINICAL DATA: Status post fall off [place], with left axillary
pain. Initial encounter.

EXAM:
LEFT RIBS AND CHEST - 3+ VIEW

[w chest pa]
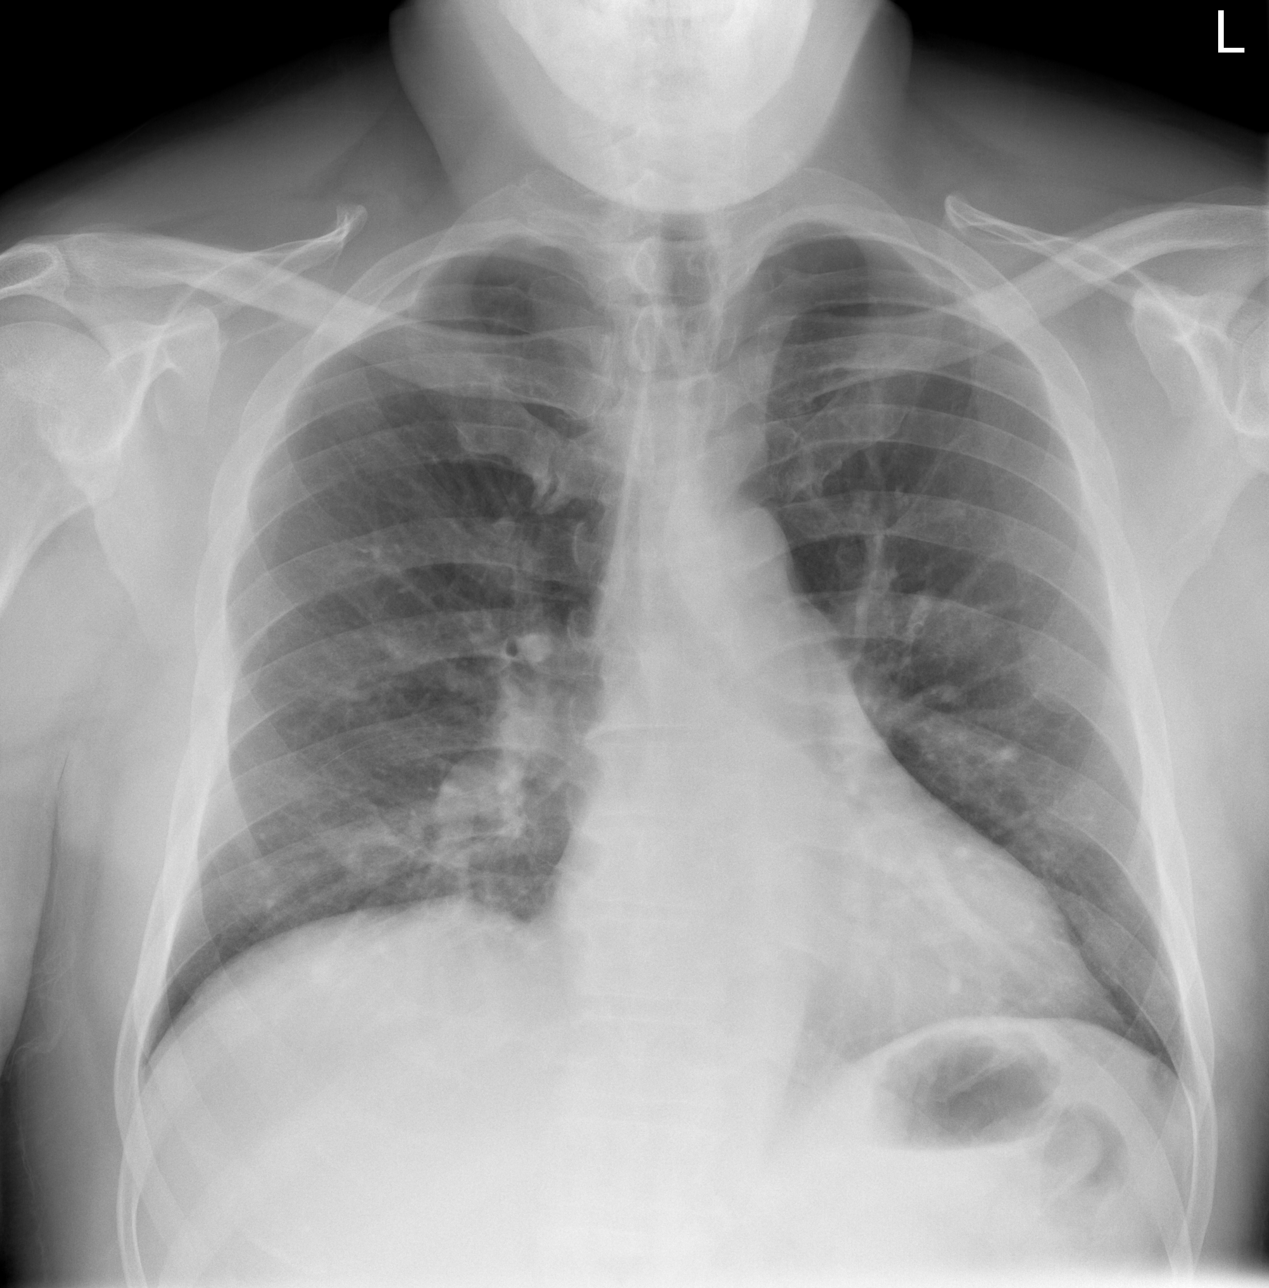

[w ribs ap/pa upper left]
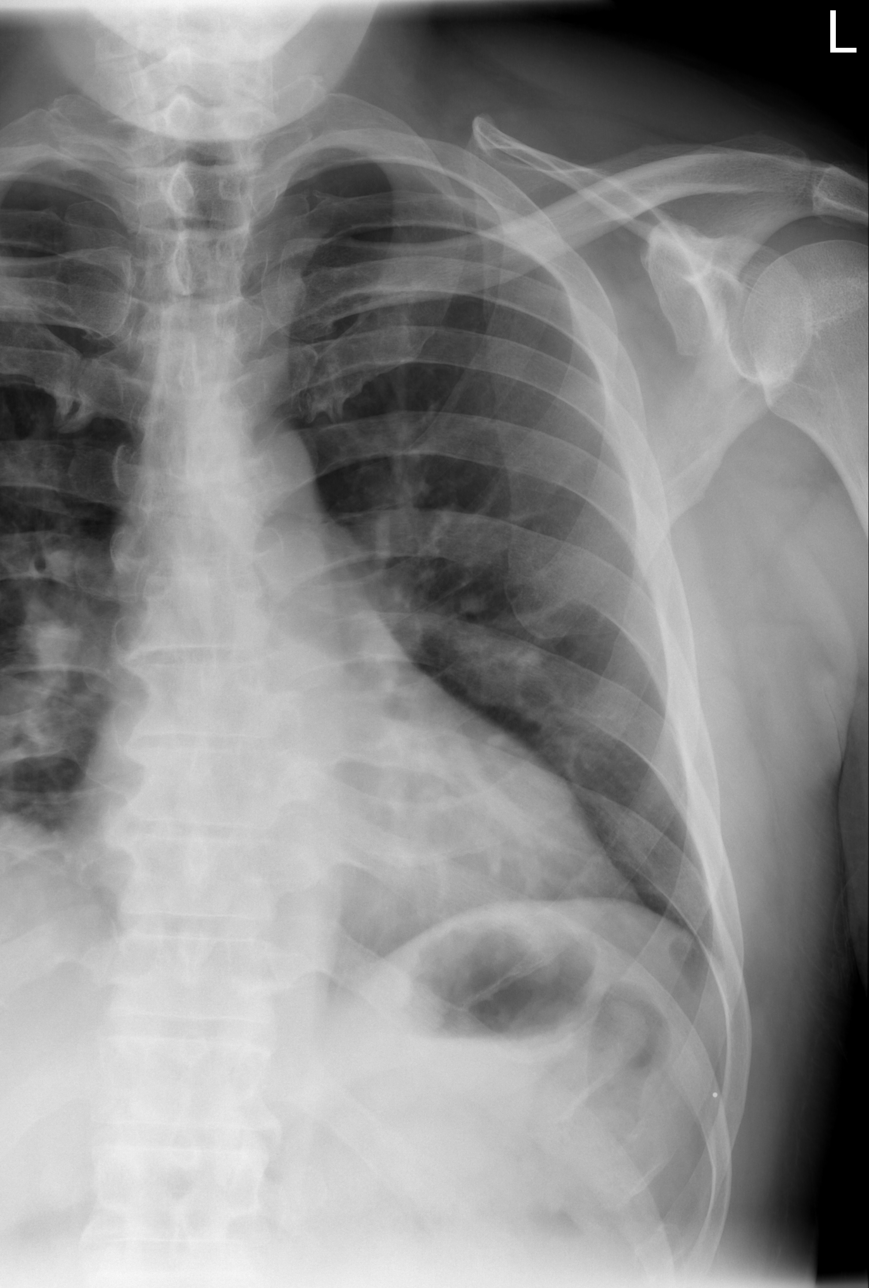

[w ribs ap/pa lower left]
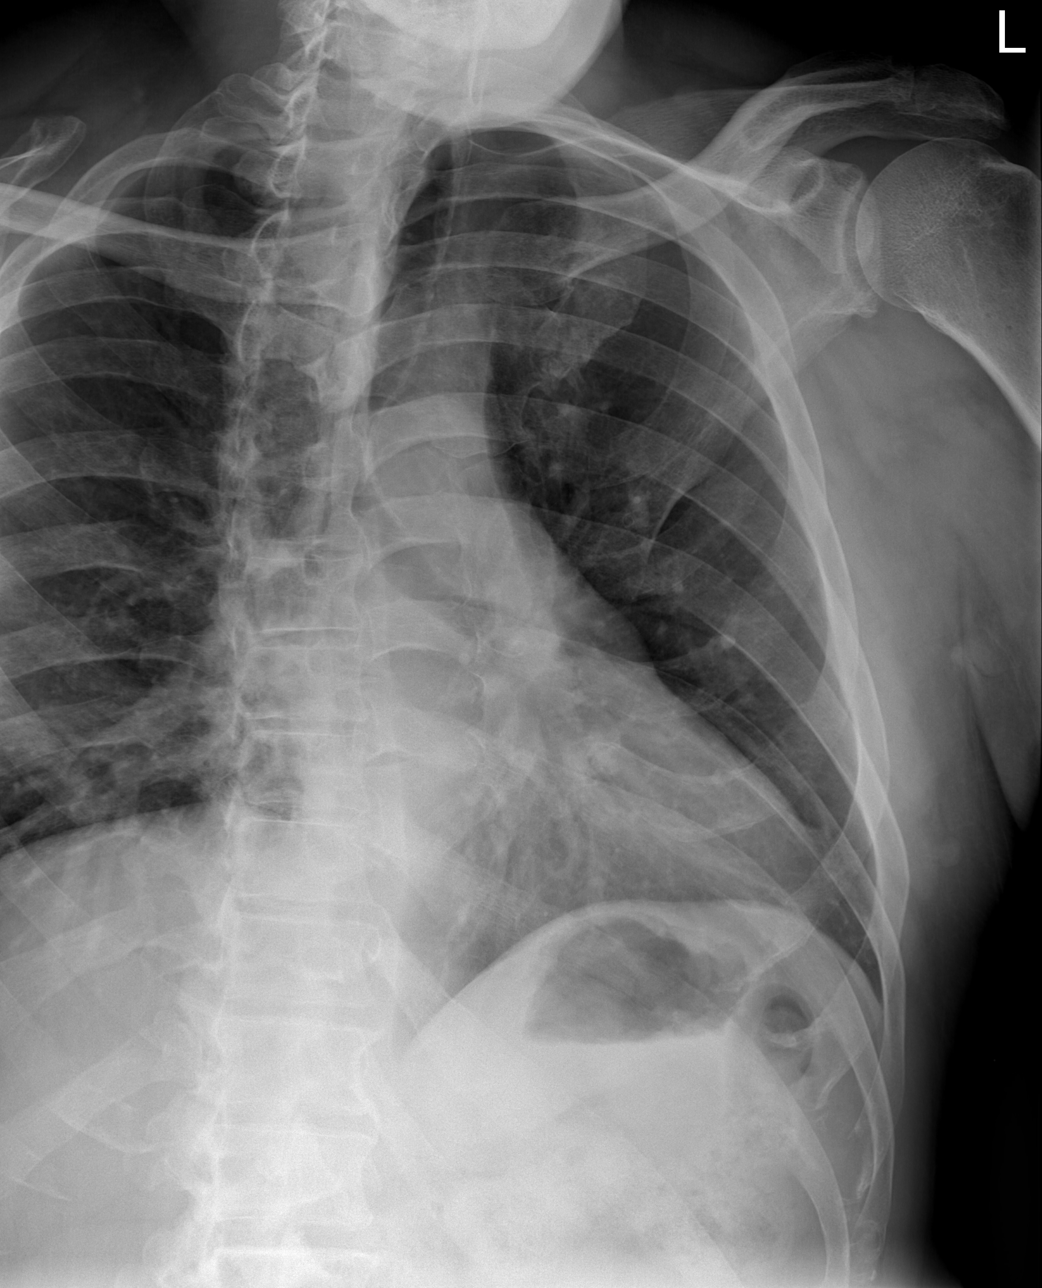

[w ribs oblique left]
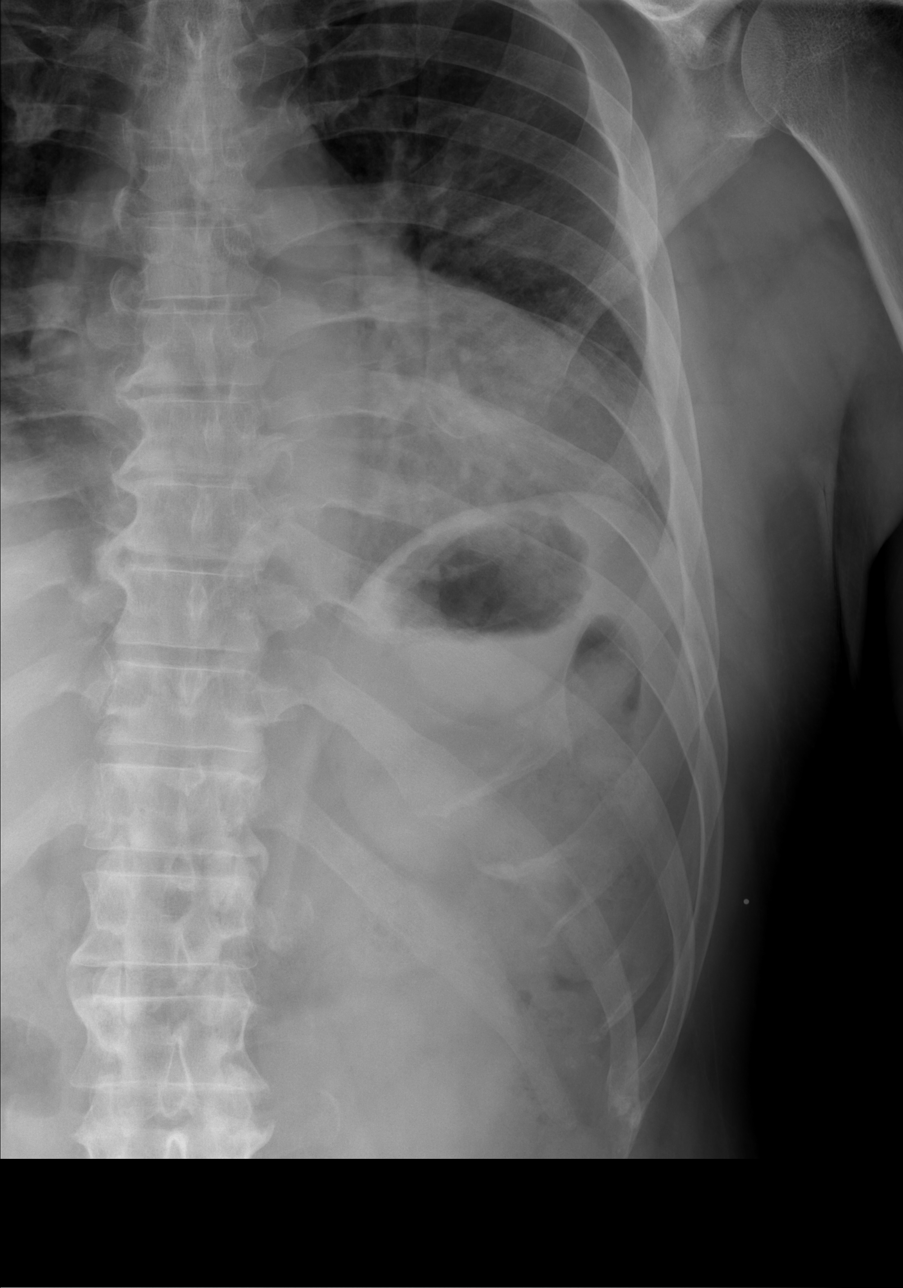

[4 of 4 positions shown; findings below may reference images not displayed]

FINDINGS: No displaced rib fractures are seen.

The lungs are well-aerated. Mild vascular congestion is noted. Mild
bibasilar atelectasis is noted. There is no evidence of pleural
effusion or pneumothorax.

The cardiomediastinal silhouette is within normal limits. No acute
osseous abnormalities are seen.
IMPRESSION: 1. No displaced rib fracture seen.
2. Mild vascular congestion.  Mild bibasilar atelectasis noted.

## 2018-04-26 MED FILL — CHLORHEXIDINE 0.12% RINSE: 0.12 | 15 days supply | Qty: 473 | Fill #0

## 2018-04-26 MED FILL — PENICILLIN VK 500 MG TABLET: 500 | 14 days supply | Qty: 56 | Fill #0

## 2018-04-26 MED FILL — HYDROCODON-APAP 5-325: 5-325 | 5 days supply | Qty: 20 | Fill #0

## 2018-05-17 ENCOUNTER — Other Ambulatory Visit: Payer: Self-pay | Admitting: Medical

## 2018-05-20 MED ORDER — LEVOTHYROXINE SODIUM 100 MCG PO TABS: 100.0000 ug | ORAL_TABLET | Freq: Every day | ORAL | 3 refills | Status: DC

## 2018-05-20 MED FILL — LEVOTHYROXINE 100 MCG TAB: 100 | 90 days supply | Qty: 90 | Fill #0

## 2018-09-13 MED FILL — LEVOTHYROXINE 100 MCG TAB: 100 | 90 days supply | Qty: 90 | Fill #1

## 2018-11-04 ENCOUNTER — Ambulatory Visit (INDEPENDENT_AMBULATORY_CARE_PROVIDER_SITE_OTHER): Payer: BLUE CROSS/BLUE SHIELD | Admitting: Medical

## 2018-11-04 ENCOUNTER — Other Ambulatory Visit: Payer: Self-pay

## 2018-11-04 ENCOUNTER — Encounter: Payer: Self-pay | Admitting: Medical

## 2018-11-04 VITALS — BP 111/54 | HR 72 | Temp 96.2°F | Resp 16 | Ht 77.0 in | Wt 255.6 lb

## 2018-11-04 DIAGNOSIS — K921 Melena: Secondary | ICD-10-CM

## 2018-11-04 DIAGNOSIS — K635 Polyp of colon: Secondary | ICD-10-CM

## 2018-11-04 DIAGNOSIS — Z125 Encounter for screening for malignant neoplasm of prostate: Secondary | ICD-10-CM

## 2018-11-04 DIAGNOSIS — Z23 Encounter for immunization: Secondary | ICD-10-CM

## 2018-11-04 DIAGNOSIS — E039 Hypothyroidism, unspecified: Secondary | ICD-10-CM | POA: Diagnosis not present

## 2018-11-04 DIAGNOSIS — R634 Abnormal weight loss: Secondary | ICD-10-CM

## 2018-11-04 DIAGNOSIS — N529 Male erectile dysfunction, unspecified: Secondary | ICD-10-CM

## 2018-11-04 DIAGNOSIS — R350 Frequency of micturition: Secondary | ICD-10-CM | POA: Diagnosis not present

## 2018-11-04 DIAGNOSIS — Z0000 Encounter for general adult medical examination without abnormal findings: Secondary | ICD-10-CM

## 2018-11-04 DIAGNOSIS — K648 Other hemorrhoids: Secondary | ICD-10-CM

## 2018-11-04 LAB — COMPREHENSIVE METABOLIC PANEL
ALT: 29 U/L (ref 0–53)
AST: 25 U/L (ref 0–37)
Albumin: 4.6 g/dL (ref 3.5–5.2)
Alkaline Phosphatase: 83 U/L (ref 39–117)
BUN: 18 mg/dL (ref 6–23)
CO2: 29 mEq/L (ref 19–32)
Calcium: 9.6 mg/dL (ref 8.4–10.5)
Chloride: 105 mEq/L (ref 96–112)
Creatinine, Ser: 1.37 mg/dL (ref 0.40–1.50)
GFR: 64.23 mL/min (ref >60.00)
Glucose, Bld: 106 mg/dL — ABNORMAL HIGH (ref 70–99)
Potassium: 4.3 mEq/L (ref 3.5–5.1)
Sodium: 141 mEq/L (ref 135–145)
Total Bilirubin: 0.9 mg/dL (ref 0.2–1.2)
Total Protein: 7.6 g/dL (ref 6.0–8.3)

## 2018-11-04 LAB — CBC WITH DIFFERENTIAL/PLATELET
Basophils Absolute: 0.1 10*3/uL (ref 0.0–0.1)
Basophils Relative: 1.1 % (ref 0.0–3.0)
Eosinophils Absolute: 0.4 10*3/uL (ref 0.0–0.7)
Eosinophils Relative: 6 % — ABNORMAL HIGH (ref 0.0–5.0)
HCT: 44.8 % (ref 39.0–52.0)
Hemoglobin: 15.1 g/dL (ref 13.0–17.0)
Lymphocytes Relative: 34.9 % (ref 12.0–46.0)
Lymphs Abs: 2.3 10*3/uL (ref 0.7–4.0)
MCHC: 33.8 g/dL (ref 30.0–36.0)
MCV: 84 fl (ref 78.0–100.0)
Monocytes Absolute: 0.7 10*3/uL (ref 0.1–1.0)
Monocytes Relative: 11.1 % (ref 3.0–12.0)
Neutro Abs: 3 10*3/uL (ref 1.4–7.7)
Neutrophils Relative %: 46.9 % (ref 43.0–77.0)
Platelets: 236 10*3/uL (ref 150.0–400.0)
RBC: 5.33 Mil/uL (ref 4.22–5.81)
RDW: 13.9 % (ref 11.5–15.5)
WBC: 6.5 10*3/uL (ref 4.0–10.5)

## 2018-11-04 LAB — POC URINALSYSI DIPSTICK (AUTOMATED)
Bilirubin, UA: NEGATIVE
Blood, UA: NEGATIVE
Glucose, UA: NEGATIVE
Ketones, UA: NEGATIVE
Leukocytes, UA: NEGATIVE
Nitrite, UA: NEGATIVE
Protein, UA: POSITIVE — AB
Spec Grav, UA: 1.025 (ref 1.010–1.025)
Urobilinogen, UA: 0.2 E.U./dL
pH, UA: 5.5 (ref 5.0–8.0)

## 2018-11-04 LAB — LIPID PANEL
Cholesterol: 180 mg/dL (ref 0–200)
HDL: 52.1 mg/dL (ref >39.00)
LDL Cholesterol: 112 mg/dL — ABNORMAL HIGH (ref 0–99)
NonHDL: 127.74
Total CHOL/HDL Ratio: 3
Triglycerides: 79 mg/dL (ref 0.0–149.0)
VLDL: 15.8 mg/dL (ref 0.0–40.0)

## 2018-11-04 LAB — LIPASE: Lipase: 28 U/L (ref 11.0–59.0)

## 2018-11-04 LAB — PSA: PSA: 1.09 ng/mL (ref 0.10–4.00)

## 2018-11-04 MED ORDER — SILDENAFIL CITRATE 100 MG PO TABS: ORAL_TABLET | ORAL | 4 refills | Status: DC

## 2018-11-04 MED FILL — SILDENAFIL CITRATE 100 MG T: 100 | 74 days supply | Qty: 10 | Fill #0

## 2018-11-04 NOTE — Patient Instructions (Addendum)
For you wellness exam today I have ordered cbc, cmp, tsh, lipid panel,psa, ua and urine culture.  Flu vaccine today.  Recommend exercise and healthy diet.  We will let you know lab results as they come in.  Follow up date appointment will be determined after lab review.   For ED, refill your viagra.  For frequent urination follow urine studies and psa.  For weight loss will add lipase to labs. Continue to follow psa and keep update on screening coloscopy.Eat 3 meals a day. If continue weight loss then consider imaging studies.   For low thyroid check tsh.  Follow up date to be determined after lab review.   Preventive Care 42-40 Years Old, Male Preventive care refers to lifestyle choices and visits with your health care provider that can promote health and wellness. This includes:  A yearly physical exam. This is also called an annual well check.  Regular dental and eye exams.  Immunizations.  Screening for certain conditions.  Healthy lifestyle choices, such as eating a healthy diet, getting regular exercise, not using drugs or products that contain nicotine and tobacco, and limiting alcohol use. What can I expect for my preventive care visit? Physical exam Your health care provider will check:  Height and weight. These may be used to calculate body mass index (BMI), which is a measurement that tells if you are at a healthy weight.  Heart rate and blood pressure.  Your skin for abnormal spots. Counseling Your health care provider may ask you questions about:  Alcohol, tobacco, and drug use.  Emotional well-being.  Home and relationship well-being.  Sexual activity.  Eating habits.  Work and work Statistician. What immunizations do I need?  Influenza (flu) vaccine  This is recommended every year. Tetanus, diphtheria, and pertussis (Tdap) vaccine  You may need a Td booster every 10 years. Varicella (chickenpox) vaccine  You may need this vaccine if you  have not already been vaccinated. Zoster (shingles) vaccine  You may need this after age 9. Measles, mumps, and rubella (MMR) vaccine  You may need at least one dose of MMR if you were born in 1957 or later. You may also need a second dose. Pneumococcal conjugate (PCV13) vaccine  You may need this if you have certain conditions and were not previously vaccinated. Pneumococcal polysaccharide (PPSV23) vaccine  You may need one or two doses if you smoke cigarettes or if you have certain conditions. Meningococcal conjugate (MenACWY) vaccine  You may need this if you have certain conditions. Hepatitis A vaccine  You may need this if you have certain conditions or if you travel or work in places where you may be exposed to hepatitis A. Hepatitis B vaccine  You may need this if you have certain conditions or if you travel or work in places where you may be exposed to hepatitis B. Haemophilus influenzae type b (Hib) vaccine  You may need this if you have certain risk factors. Human papillomavirus (HPV) vaccine  If recommended by your health care provider, you may need three doses over 6 months. You may receive vaccines as individual doses or as more than one vaccine together in one shot (combination vaccines). Talk with your health care provider about the risks and benefits of combination vaccines. What tests do I need? Blood tests  Lipid and cholesterol levels. These may be checked every 5 years, or more frequently if you are over 45 years old.  Hepatitis C test.  Hepatitis B test. Screening  Lung  cancer screening. You may have this screening every year starting at age 36 if you have a 30-pack-year history of smoking and currently smoke or have quit within the past 15 years.  Prostate cancer screening. Recommendations will vary depending on your family history and other risks.  Colorectal cancer screening. All adults should have this screening starting at age 32 and continuing  until age 47. Your health care provider may recommend screening at age 49 if you are at increased risk. You will have tests every 1-10 years, depending on your results and the type of screening test.  Diabetes screening. This is done by checking your blood sugar (glucose) after you have not eaten for a while (fasting). You may have this done every 1-3 years.  Sexually transmitted disease (STD) testing. Follow these instructions at home: Eating and drinking  Eat a diet that includes fresh fruits and vegetables, whole grains, lean protein, and low-fat dairy products.  Take vitamin and mineral supplements as recommended by your health care provider.  Do not drink alcohol if your health care provider tells you not to drink.  If you drink alcohol: ? Limit how much you have to 0-2 drinks a day. ? Be aware of how much alcohol is in your drink. In the [place] one drink equals one 12 oz bottle of beer (355 mL), one 5 oz glass of wine (148 mL), or one 1 oz glass of hard liquor (44 mL). Lifestyle  Take daily care of your teeth and gums.  Stay active. Exercise for at least 30 minutes on 5 or more days each week.  Do not use any products that contain nicotine or tobacco, such as cigarettes, e-cigarettes, and chewing tobacco. If you need help quitting, ask your health care provider.  If you are sexually active, practice safe sex. Use a condom or other form of protection to prevent STIs (sexually transmitted infections).  Talk with your health care provider about taking a low-dose aspirin every day starting at age 51. What's next?  Go to your health care provider once a year for a well check visit.  Ask your health care provider how often you should have your eyes and teeth checked.  Stay up to date on all vaccines. This information is not intended to replace advice given to you by your health care provider. Make sure you discuss any questions you have with your health care provider. Document  Released: 02/01/2015 Document Revised: 12/30/2017 Document Reviewed: 12/30/2017 Elsevier Patient Education  2020 [employer].

## 2018-11-04 NOTE — Progress Notes (Signed)
Subjective:    Patient ID: Carlos Mccarty., male    DOB: 1960-05-23, 58 y.o.   MRN: [id number]  HPI  Patient is here for annual exam.   Patient thinks he has lost about 10 pounds over the past couple months. Reports poor appetite recently, states "I'm just not hungry. I have to make myself eat."  Normal sense of taste and smell. States not effort to loose weight. He states historically if he does not eat 3 meals a day will loose weight quickly. He historically he states since age 76 years old he would loose weight quickly if he did not eat 3 meals. He denies any nausea or abdomen pain.  Reports ED. Reports he tried viagra in the past with minimal improvement, but now reporting decreased libido in general. He would like to try it again. Pt states viagra worked in the past.  Reports eating fairly healthy diet. Exercise 3x week via weights and basketball. Denies smoking. Occasional alcohol use when watching sports.   States he has been taking Beta-prostate supplement for nocturia/frequency over the past 3 weeks. Reports it has helped. He is no longer waking up to urinate.   Colonscopy due in 2022 per chart review.  Flu shot today.  Fasting for labs.    Review of Systems  Constitutional: Positive for unexpected weight change. Negative for chills, fatigue and fever.  HENT: Negative for congestion, ear pain, postnasal drip, rhinorrhea, sinus pressure, sinus pain, sneezing, sore throat and trouble swallowing.   Eyes: Negative for visual disturbance.  Respiratory: Negative for chest tightness, shortness of breath and wheezing.   Cardiovascular: Negative for chest pain and palpitations.  Gastrointestinal: Negative for abdominal pain, blood in stool, constipation and diarrhea.  Endocrine: Negative for polydipsia, polyphagia and polyuria.  Genitourinary: Positive for frequency. Negative for dysuria and hematuria.       ED.  Musculoskeletal: Negative for arthralgias and myalgias.  Skin:  Negative for rash.  Neurological: Negative for dizziness, light-headedness and headaches.  Psychiatric/Behavioral: Negative for confusion, dysphoric mood, sleep disturbance and suicidal ideas. The patient is not nervous/anxious.     Past Medical History:  Diagnosis Date  . Thyroid disease    hypothyroidism     Social History   Socioeconomic History  . Marital status: Single    Spouse name: Not on file  . Number of children: Not on file  . Years of education: Not on file  . Highest education level: Not on file  Occupational History  . Not on file  Social Needs  . Financial resource strain: Not on file  . Food insecurity    Worry: Not on file    Inability: Not on file  . Transportation needs    Medical: Not on file    Non-medical: Not on file  Tobacco Use  . Smoking status: Never Smoker  . Smokeless tobacco: Never Used  Substance and Sexual Activity  . Alcohol use: Yes    Alcohol/week: 3.0 standard drinks    Types: 3 Cans of beer per week  . Drug use: No  . Sexual activity: Yes  Lifestyle  . Physical activity    Days per week: Not on file    Minutes per session: Not on file  . Stress: Not on file  Relationships  . Social Herbalist on phone: Not on file    Gets together: Not on file    Attends religious service: Not on file    Active member of club  or organization: Not on file    Attends meetings of clubs or organizations: Not on file    Relationship status: Not on file  . Intimate partner violence    Fear of current or ex partner: Not on file    Emotionally abused: Not on file    Physically abused: Not on file    Forced sexual activity: Not on file  Other Topics Concern  . Not on file  Social History Narrative  . Not on file    Past Surgical History:  Procedure Laterality Date  . ARTHROSCOPIC REPAIR ACL Left 2000  . FINGER AMPUTATION  2015   3rd finger left hand  . MENISCUS REPAIR Right 1990  . NAILBED REPAIR Left 04/16/2015   Procedure:  nailbed excision left middle finger;  Surgeon: Hessie Knows, MD;  Location: [facility] ORS;  Service: Orthopedics;  Laterality: Left;  . THUMB ARTHROSCOPY Left 2014  . WRIST FUSION Right 2000    Family History  Problem Relation Age of Onset  . Hypertension Father   . Dementia Father   . Colon cancer Neg Hx     No Known Allergies  Current Outpatient Medications on File Prior to Visit  Medication Sig Dispense Refill  . levothyroxine (SYNTHROID) 100 MCG tablet Take 1 tablet (100 mcg total) by mouth daily. 90 tablet 3  . sildenafil (VIAGRA) 100 MG tablet 1 tab po 1 hour prior to sex (Patient not taking: Reported on 11/04/2018) 10 tablet 0   Current Facility-Administered Medications on File Prior to Visit  Medication Dose Route Frequency Provider Last Rate Last Dose  . 0.9 %  sodium chloride infusion  500 mL Intravenous Continuous Pyrtle, Lajuan Lines, MD        BP (!) 111/54   Pulse 72   Temp (!) 96.2 F (35.7 C) (Oral)   Resp 16   Ht 6\' 5"  (1.956 m)   Wt 255 lb 9.6 oz (115.9 kg)   SpO2 100%   BMI 30.31 kg/m       Objective:   Physical Exam  General Mental Status- Alert. General Appearance- Not in acute distress.   Skin General: Color- Normal Color. Moisture- Normal Moisture.  Neck Carotid Arteries- Normal color. Moisture- Normal Moisture. No carotid bruits. No JVD.  Chest and Lung Exam Auscultation: Breath Sounds:-Normal.  Cardiovascular Auscultation:Rythm- Regular. Murmurs & Other Heart Sounds:Auscultation of the heart reveals- No Murmurs.  Abdomen Inspection:-Inspeection Normal. Palpation/Percussion:Note:No mass. Palpation and Percussion of the abdomen reveal- Non Tender, Non Distended + BS, no rebound or guarding.   Neurologic Cranial Nerve exam:- CN III-XII intact(No nystagmus), symmetric smile. Drift Test:- No drift. Finger to Nose:- Normal/Intact Strength:- 5/5 equal and symmetric strength both upper and lower extremities.  Genital- left side inguinal hernia.  About same as before(pt elected not do surgery after surgeon appointment)  Rectal- smooth, symmetric and felt normal in size.       Assessment & Plan:  For you wellness exam today I have ordered cbc, cmp, tsh, lipid panel,psa, ua and urine culture.  Flu vaccine today.  Recommend exercise and healthy diet.  We will let you know lab results as they come in.  Follow up date appointment will be determined after lab review.   For ED, refill your viagra.  For frequent urination follow urine studies and psa.  For weight loss will add lipase to labs. Continue to follow psa and keep update on screening coloscopy.Eat 3 meals a day. If continue weight loss then consider imaging studies.  For low thyroid check tsh.  Follow up date to be determined after lab review.  [place] charge as well. Addressed weight loss, low thyroid, ED and frequent urination.  Mackie Pai, PA-C

## 2018-11-05 LAB — URINE CULTURE
MICRO NUMBER:: 998297
Result:: NO GROWTH
SPECIMEN QUALITY:: ADEQUATE

## 2018-11-07 ENCOUNTER — Other Ambulatory Visit (INDEPENDENT_AMBULATORY_CARE_PROVIDER_SITE_OTHER): Payer: BLUE CROSS/BLUE SHIELD

## 2018-11-07 ENCOUNTER — Telehealth: Payer: Self-pay | Admitting: Medical

## 2018-11-07 DIAGNOSIS — R739 Hyperglycemia, unspecified: Secondary | ICD-10-CM

## 2018-11-07 LAB — HEMOGLOBIN A1C: Hgb A1c MFr Bld: 6.5 % (ref 4.6–6.5)

## 2018-11-07 MED ORDER — METFORMIN HCL 500 MG PO TABS: 500.0000 mg | ORAL_TABLET | Freq: Every day | ORAL | 3 refills | Status: AC

## 2018-11-07 MED FILL — metFORMIN HCL 500 MG TABS: 500 | 90 days supply | Qty: 90 | Fill #0

## 2018-11-07 NOTE — Telephone Encounter (Signed)
Rx metformin sent to pt pharmacy. 

## 2018-12-14 ENCOUNTER — Encounter: Payer: Self-pay | Admitting: Medical

## 2018-12-29 DIAGNOSIS — H5203 Hypermetropia, bilateral: Secondary | ICD-10-CM | POA: Diagnosis not present

## 2019-02-09 MED FILL — SILDENAFIL CITRATE 100 MG T: 100 | 74 days supply | Qty: 10 | Fill #1

## 2019-03-17 MED FILL — LEVOTHYROXINE 100 MCG TAB: 100 | 90 days supply | Qty: 90 | Fill #3

## 2019-05-02 MED FILL — SILDENAFIL CITRATE 100 MG T: 100 | 10 days supply | Qty: 10 | Fill #2

## 2019-07-10 ENCOUNTER — Other Ambulatory Visit: Payer: Self-pay | Admitting: Medical

## 2019-07-10 MED FILL — LEVOTHYROXINE SODIUM 100 MC: 100 | 90 days supply | Qty: 90 | Fill #0

## 2019-11-07 ENCOUNTER — Other Ambulatory Visit: Payer: Self-pay

## 2019-11-07 ENCOUNTER — Ambulatory Visit (INDEPENDENT_AMBULATORY_CARE_PROVIDER_SITE_OTHER): Payer: Commercial Managed Care - PPO | Admitting: Medical

## 2019-11-07 ENCOUNTER — Other Ambulatory Visit: Payer: Self-pay | Admitting: Medical

## 2019-11-07 VITALS — BP 134/86 | HR 82 | Temp 98.2°F | Resp 18 | Ht 77.0 in | Wt 263.0 lb

## 2019-11-07 DIAGNOSIS — N529 Male erectile dysfunction, unspecified: Secondary | ICD-10-CM | POA: Diagnosis not present

## 2019-11-07 DIAGNOSIS — Z0000 Encounter for general adult medical examination without abnormal findings: Secondary | ICD-10-CM | POA: Diagnosis not present

## 2019-11-07 DIAGNOSIS — R739 Hyperglycemia, unspecified: Secondary | ICD-10-CM

## 2019-11-07 DIAGNOSIS — E119 Type 2 diabetes mellitus without complications: Secondary | ICD-10-CM | POA: Diagnosis not present

## 2019-11-07 DIAGNOSIS — Z125 Encounter for screening for malignant neoplasm of prostate: Secondary | ICD-10-CM

## 2019-11-07 DIAGNOSIS — E039 Hypothyroidism, unspecified: Secondary | ICD-10-CM

## 2019-11-07 LAB — CBC WITH DIFFERENTIAL/PLATELET
Basophils Absolute: 51 cells/uL (ref 0–200)
Basophils Relative: 0.7 %
Eosinophils Relative: 3 %
HCT: 46.7 % (ref 38.5–50.0)
MCH: 27.5 pg (ref 27.0–33.0)
MCHC: 32.3 g/dL (ref 32.0–36.0)
MCV: 84.9 fL (ref 80.0–100.0)
MPV: 11.9 fL (ref 7.5–12.5)
Neutro Abs: 3927 cells/uL (ref 1500–7800)
Total Lymphocyte: 32.8 %

## 2019-11-07 MED ORDER — SILDENAFIL CITRATE 100 MG PO TABS: ORAL_TABLET | ORAL | 4 refills | Status: DC

## 2019-11-07 MED FILL — SILDENAFIL CITRATE 100 MG T: 100 | 10 days supply | Qty: 10 | Fill #0

## 2019-11-07 NOTE — Progress Notes (Signed)
Subjective:    Patient ID: Carlos Mccarty., male    DOB: 12-07-1960, 59 y.o.   MRN: [id number]  HPI Patient is here for annual exam.    Last visit with me patients stated   "Patient thinks he has lost about 10 pounds over the past couple months. Reports poor appetite recently, states "I'm just not hungry. I have to make myself eat."  Normal sense of taste and smell. States not effort to loose weight. He states historically if he does not eat 3 meals a day will loose weight quickly. He historically he states since age 75 years old he would loose weight quickly if he did not eat 3 meals. He denies any nausea or abdomen pain."  Since last visit he did gain weight again. Gained about 8 lbs since last year. But still eating less historically since so busy at work can't eat 3 meals a day.  Reports ED. Reports he tried viagra in the past with minimal improvement, but now reporting decreased libido in general. He would like to try it again. Pt states viagra worked in the past.  Reports eating fairly healthy diet. Exercise 3x week via weights and basketball. Denies smoking. Occasional alcohol use when watching sports.    Colonscopy due in 2022 per chart review.  Flu shot at work early in month.  Fasting for labs.    Review of Systems  Constitutional: Negative for chills, fatigue and fever.  Respiratory: Negative for chest tightness and shortness of breath.   Cardiovascular: Negative for chest pain and palpitations.  Gastrointestinal: Negative for abdominal pain.  Genitourinary: Negative for hematuria.  Musculoskeletal: Negative for back pain and myalgias.  Psychiatric/Behavioral: Negative for behavioral problems, decreased concentration, dysphoric mood and sleep disturbance. The patient is not nervous/anxious.    Past Medical History:  Diagnosis Date  . Thyroid disease    hypothyroidism     Social History   Socioeconomic History  . Marital status: Single    Spouse name:  Not on file  . Number of children: Not on file  . Years of education: Not on file  . Highest education level: Not on file  Occupational History  . Not on file  Tobacco Use  . Smoking status: Never Smoker  . Smokeless tobacco: Never Used  Substance and Sexual Activity  . Alcohol use: Yes    Alcohol/week: 3.0 standard drinks    Types: 3 Cans of beer per week  . Drug use: No  . Sexual activity: Yes  Other Topics Concern  . Not on file  Social History Narrative  . Not on file   Social Determinants of Health   Financial Resource Strain:   . Difficulty of Paying Living Expenses: Not on file  Food Insecurity:   . Worried About Charity fundraiser in the Last Year: Not on file  . Ran Out of Food in the Last Year: Not on file  Transportation Needs:   . Lack of Transportation (Medical): Not on file  . Lack of Transportation (Non-Medical): Not on file  Physical Activity:   . Days of Exercise per Week: Not on file  . Minutes of Exercise per Session: Not on file  Stress:   . Feeling of Stress : Not on file  Social Connections:   . Frequency of Communication with Friends and Family: Not on file  . Frequency of Social Gatherings with Friends and Family: Not on file  . Attends Religious Services: Not on file  . Active  Member of Clubs or Organizations: Not on file  . Attends Archivist Meetings: Not on file  . Marital Status: Not on file  Intimate Partner Violence:   . Fear of Current or Ex-Partner: Not on file  . Emotionally Abused: Not on file  . Physically Abused: Not on file  . Sexually Abused: Not on file    Past Surgical History:  Procedure Laterality Date  . ARTHROSCOPIC REPAIR ACL Left 2000  . FINGER AMPUTATION  2015   3rd finger left hand  . MENISCUS REPAIR Right 1990  . NAILBED REPAIR Left 04/16/2015   Procedure: nailbed excision left middle finger;  Surgeon: Hessie Knows, MD;  Location: [facility] ORS;  Service: Orthopedics;  Laterality: Left;  . THUMB  ARTHROSCOPY Left 2014  . WRIST FUSION Right 2000    Family History  Problem Relation Age of Onset  . Hypertension Father   . Dementia Father   . Colon cancer Neg Hx     No Known Allergies  Current Outpatient Medications on File Prior to Visit  Medication Sig Dispense Refill  . levothyroxine (SYNTHROID) 100 MCG tablet TAKE 1 TABLET (100 MCG TOTAL) BY MOUTH DAILY. 90 tablet 3  . metFORMIN (GLUCOPHAGE) 500 MG tablet Take 1 tablet (500 mg total) by mouth daily with breakfast. 90 tablet 3   Current Facility-Administered Medications on File Prior to Visit  Medication Dose Route Frequency Provider Last Rate Last Admin  . 0.9 %  sodium chloride infusion  500 mL Intravenous Continuous Pyrtle, Lajuan Lines, MD        BP 134/86   Pulse 82   Temp 98.2 F (36.8 C) (Oral)   Resp 18   Ht 6\' 5"  (1.956 m)   Wt 263 lb (119.3 kg)   SpO2 100%   BMI 31.19 kg/m       Objective:   Physical Exam  General Mental Status- Alert. General Appearance- Not in acute distress.   Skin General: Color- Normal Color. Moisture- Normal Moisture.  Neck Carotid Arteries- Normal color. Moisture- Normal Moisture. No carotid bruits. No JVD.  Chest and Lung Exam Auscultation: Breath Sounds:-Normal.  Cardiovascular Auscultation:Rythm- Regular. Murmurs & Other Heart Sounds:Auscultation of the heart reveals- No Murmurs.  Abdomen Inspection:-Inspeection Normal. Palpation/Percussion:Note:No mass. Palpation and Percussion of the abdomen reveal- Non Tender, Non Distended + BS, no rebound or guarding.    Neurologic Cranial Nerve exam:- CN III-XII intact(No nystagmus), symmetric smile. Drift Test:- No drift. Romberg Exam:- Negative.  Heal to Toe Gait exam:-Normal. Finger to Nose:- Normal/Intact Strength:- 5/5 equal and symmetric strength both upper and lower extremities.      Assessment & Plan:  For you wellness exam today I have ordered cbc, cmp, lipid panel, psa and a1c.  Vaccine up to  date.  Recommend exercise and healthy diet.  We will let you know lab results as they come in.  Follow up date appointment will be determined after lab review.   For ED refilled viagra. Rx advismement.  For low thyroid adding tsh and t4.  For prostate cancer screening add psa.  Mackie Pai, [place]   [id number] charge as well as addressed elevated sugar, hypothyroid and ED.

## 2019-11-07 NOTE — Patient Instructions (Addendum)
For you wellness exam today I have ordered cbc, cmp, lipid panel, psa and a1c.  Vaccine up to date.  Recommend exercise and healthy diet.  We will let you know lab results as they come in.  Follow up date appointment will be determined after lab review.   For ED refilled viagra. Rx advismement.  For low thyroid adding tsh and t4.  For prostate cancer screening add psa.  For elevated sugar will follow a1c. Advise on starting metformin which I presribed last year.     Preventive Care 62-69 Years Old, Male Preventive care refers to lifestyle choices and visits with your health care provider that can promote health and wellness. This includes:  A yearly physical exam. This is also called an annual well check.  Regular dental and eye exams.  Immunizations.  Screening for certain conditions.  Healthy lifestyle choices, such as eating a healthy diet, getting regular exercise, not using drugs or products that contain nicotine and tobacco, and limiting alcohol use. What can I expect for my preventive care visit? Physical exam Your health care provider will check:  Height and weight. These may be used to calculate body mass index (BMI), which is a measurement that tells if you are at a healthy weight.  Heart rate and blood pressure.  Your skin for abnormal spots. Counseling Your health care provider may ask you questions about:  Alcohol, tobacco, and drug use.  Emotional well-being.  Home and relationship well-being.  Sexual activity.  Eating habits.  Work and work Statistician. What immunizations do I need?  Influenza (flu) vaccine  This is recommended every year. Tetanus, diphtheria, and pertussis (Tdap) vaccine  You may need a Td booster every 10 years. Varicella (chickenpox) vaccine  You may need this vaccine if you have not already been vaccinated. Zoster (shingles) vaccine  You may need this after age 32. Measles, mumps, and rubella (MMR)  vaccine  You may need at least one dose of MMR if you were born in 1957 or later. You may also need a second dose. Pneumococcal conjugate (PCV13) vaccine  You may need this if you have certain conditions and were not previously vaccinated. Pneumococcal polysaccharide (PPSV23) vaccine  You may need one or two doses if you smoke cigarettes or if you have certain conditions. Meningococcal conjugate (MenACWY) vaccine  You may need this if you have certain conditions. Hepatitis A vaccine  You may need this if you have certain conditions or if you travel or work in places where you may be exposed to hepatitis A. Hepatitis B vaccine  You may need this if you have certain conditions or if you travel or work in places where you may be exposed to hepatitis B. Haemophilus influenzae type b (Hib) vaccine  You may need this if you have certain risk factors. Human papillomavirus (HPV) vaccine  If recommended by your health care provider, you may need three doses over 6 months. You may receive vaccines as individual doses or as more than one vaccine together in one shot (combination vaccines). Talk with your health care provider about the risks and benefits of combination vaccines. What tests do I need? Blood tests  Lipid and cholesterol levels. These may be checked every 5 years, or more frequently if you are over 93 years old.  Hepatitis C test.  Hepatitis B test. Screening  Lung cancer screening. You may have this screening every year starting at age 49 if you have a 30-pack-year history of smoking and currently smoke  or have quit within the past 15 years.  Prostate cancer screening. Recommendations will vary depending on your family history and other risks.  Colorectal cancer screening. All adults should have this screening starting at age 25 and continuing until age 55. Your health care provider may recommend screening at age 50 if you are at increased risk. You will have tests every  1-10 years, depending on your results and the type of screening test.  Diabetes screening. This is done by checking your blood sugar (glucose) after you have not eaten for a while (fasting). You may have this done every 1-3 years.  Sexually transmitted disease (STD) testing. Follow these instructions at home: Eating and drinking  Eat a diet that includes fresh fruits and vegetables, whole grains, lean protein, and low-fat dairy products.  Take vitamin and mineral supplements as recommended by your health care provider.  Do not drink alcohol if your health care provider tells you not to drink.  If you drink alcohol: ? Limit how much you have to 0-2 drinks a day. ? Be aware of how much alcohol is in your drink. In the [place] one drink equals one 12 oz bottle of beer (355 mL), one 5 oz glass of wine (148 mL), or one 1 oz glass of hard liquor (44 mL). Lifestyle  Take daily care of your teeth and gums.  Stay active. Exercise for at least 30 minutes on 5 or more days each week.  Do not use any products that contain nicotine or tobacco, such as cigarettes, e-cigarettes, and chewing tobacco. If you need help quitting, ask your health care provider.  If you are sexually active, practice safe sex. Use a condom or other form of protection to prevent STIs (sexually transmitted infections).  Talk with your health care provider about taking a low-dose aspirin every day starting at age 92. What's next?  Go to your health care provider once a year for a well check visit.  Ask your health care provider how often you should have your eyes and teeth checked.  Stay up to date on all vaccines. This information is not intended to replace advice given to you by your health care provider. Make sure you discuss any questions you have with your health care provider. Document Revised: 12/30/2017 Document Reviewed: 12/30/2017 Elsevier Patient Education  2020 [employer].

## 2019-11-08 LAB — HEMOGLOBIN A1C
Hgb A1c MFr Bld: 5.9 % of total Hgb — ABNORMAL HIGH (ref <5.7)
Mean Plasma Glucose: 123 (calc)
eAG (mmol/L): 6.8 (calc)

## 2019-11-08 LAB — COMPREHENSIVE METABOLIC PANEL
AG Ratio: 1.6 (calc) (ref 1.0–2.5)
ALT: 30 U/L (ref 9–46)
AST: 28 U/L (ref 10–35)
Albumin: 4.7 g/dL (ref 3.6–5.1)
Alkaline phosphatase (APISO): 81 U/L (ref 35–144)
BUN/Creatinine Ratio: 12 (calc) (ref 6–22)
BUN: 17 mg/dL (ref 7–25)
CO2: 27 mmol/L (ref 20–32)
Calcium: 9.7 mg/dL (ref 8.6–10.3)
Chloride: 106 mmol/L (ref 98–110)
Creat: 1.43 mg/dL — ABNORMAL HIGH (ref 0.70–1.25)
Globulin: 3 g/dL (calc) (ref 1.9–3.7)
Glucose, Bld: 88 mg/dL (ref 65–99)
Potassium: 4.4 mmol/L (ref 3.5–5.3)
Sodium: 141 mmol/L (ref 135–146)
Total Bilirubin: 0.5 mg/dL (ref 0.2–1.2)
Total Protein: 7.7 g/dL (ref 6.1–8.1)

## 2019-11-08 LAB — CBC WITH DIFFERENTIAL/PLATELET
Absolute Monocytes: 708 cells/uL (ref 200–950)
Eosinophils Absolute: 219 cells/uL (ref 15–500)
Hemoglobin: 15.1 g/dL (ref 13.2–17.1)
Lymphs Abs: 2394 cells/uL (ref 850–3900)
Monocytes Relative: 9.7 %
Neutrophils Relative %: 53.8 %
Platelets: 265 10*3/uL (ref 140–400)
RBC: 5.5 10*6/uL (ref 4.20–5.80)
RDW: 13.5 % (ref 11.0–15.0)
WBC: 7.3 10*3/uL (ref 3.8–10.8)

## 2019-11-08 LAB — LIPID PANEL
Cholesterol: 188 mg/dL (ref <200)
HDL: 56 mg/dL (ref >=40)
LDL Cholesterol (Calc): 112 mg/dL (calc) — ABNORMAL HIGH
Non-HDL Cholesterol (Calc): 132 mg/dL (calc) — ABNORMAL HIGH (ref <130)
Total CHOL/HDL Ratio: 3.4 (calc) (ref <5.0)
Triglycerides: 96 mg/dL (ref <150)

## 2019-11-08 LAB — T4, FREE: Free T4: 1.1 ng/dL (ref 0.8–1.8)

## 2019-11-08 LAB — PSA: PSA: 0.87 ng/mL (ref <=4.0)

## 2019-11-08 LAB — TSH: TSH: 16.52 mIU/L — ABNORMAL HIGH (ref 0.40–4.50)

## 2019-11-16 MED FILL — LEVOTHYROXINE SODIUM 100 MC: 100 | 90 days supply | Qty: 90 | Fill #1

## 2019-12-22 ENCOUNTER — Other Ambulatory Visit (HOSPITAL_BASED_OUTPATIENT_CLINIC_OR_DEPARTMENT_OTHER): Payer: Self-pay | Admitting: Internal Medicine

## 2019-12-22 ENCOUNTER — Ambulatory Visit: Payer: Commercial Managed Care - PPO | Attending: Internal Medicine

## 2019-12-22 DIAGNOSIS — Z23 Encounter for immunization: Secondary | ICD-10-CM

## 2019-12-22 NOTE — Progress Notes (Signed)
° °  [facility]  Name:  Carlos Mccarty.    MRN: [id number] DOB: December 13, 1960  12/22/2019  [name] was observed post Covid-19 immunization for 15 minutes without incident. He was provided with Vaccine Information Sheet and instruction to access the V-Safe system.   [name] was instructed to call 911 with any severe reactions post vaccine:  Difficulty breathing   Swelling of face and throat   A fast heartbeat   A bad rash all over body   Dizziness and weakness   Immunizations Administered    Name Date Dose VIS Date Route   Pfizer COVID-19 Vaccine 12/22/2019  9:29 AM 0.3 mL 11/08/2019 Intramuscular   Manufacturer: [place]   Lot: [id number]   [place]: [id number]

## 2019-12-25 MED FILL — PFIZER-BIONTECH COVID-19 VA: 30 | 1 days supply | Qty: 0 | Fill #0

## 2020-03-28 MED FILL — LEVOTHYROXINE SODIUM 100 MC: 100 | 90 days supply | Qty: 90 | Fill #2

## 2020-04-15 ENCOUNTER — Other Ambulatory Visit (HOSPITAL_BASED_OUTPATIENT_CLINIC_OR_DEPARTMENT_OTHER): Payer: Self-pay

## 2020-04-15 MED FILL — AMOXICILLIN 500 MG CAPSULE: 500 | 7 days supply | Qty: 21 | Fill #0

## 2020-06-03 ENCOUNTER — Ambulatory Visit: Payer: Commercial Managed Care - PPO | Admitting: Medical

## 2020-06-03 ENCOUNTER — Other Ambulatory Visit (HOSPITAL_BASED_OUTPATIENT_CLINIC_OR_DEPARTMENT_OTHER): Payer: Self-pay

## 2020-06-03 ENCOUNTER — Other Ambulatory Visit: Payer: Self-pay

## 2020-06-03 VITALS — BP 122/65 | HR 54 | Resp 20 | Ht 77.0 in | Wt 263.8 lb

## 2020-06-03 DIAGNOSIS — H60503 Unspecified acute noninfective otitis externa, bilateral: Secondary | ICD-10-CM | POA: Diagnosis not present

## 2020-06-03 MED ORDER — NEOMYCIN-POLYMYXIN-HC 3.5-10000-1 OT SOLN
3.0000 [drp] | Freq: Four times a day (QID) | OTIC | 0 refills | Status: DC
  Filled 2020-06-03: qty 10, 17d supply, fill #0

## 2020-06-03 NOTE — Patient Instructions (Addendum)
You do have bilateral otitis externa on exam. Use cortisoprin otic drops. Stop using ear plugs and use noise blocking head phone. 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  If symptoms change or worsen let us know.  Follow 10 days or as needed  Do recommend that you get scheduled for wellness exam between now and October.

## 2020-06-03 NOTE — Progress Notes (Signed)
Subjective:    Patient ID: Carlos Mccarty., male    DOB: April 11, 1960, 60 y.o.   MRN: [id number]  HPI  Pt in with ear aches on both ears and  Slight decreased hearing.. At first on left side then some on rt side. No nasal congestion. Pt states hearing is decreased.  Pt states after symptoms started he would soak his ear plugs twice in alcohol and next day did peroxide. Seems to help briefly but ear still hurt.   No preceding nasal congestion or allergy/infectious signs or symptoms.    Review of Systems  Constitutional: Negative for chills, fatigue and fever.  HENT: Negative for congestion.        See hpi on ears.  Respiratory: Negative for cough, chest tightness, shortness of breath and wheezing.   Cardiovascular: Negative for chest pain and palpitations.  Gastrointestinal: Negative for abdominal pain.  Genitourinary: Negative for dysuria and enuresis.  Musculoskeletal: Negative for back pain.  Neurological: Negative for dizziness, syncope, speech difficulty, weakness, light-headedness and headaches.  Hematological: Negative for adenopathy. Does not bruise/bleed easily.  Psychiatric/Behavioral: Negative for behavioral problems and confusion.   Past Medical History:  Diagnosis Date  . Thyroid disease    hypothyroidism     Social History   Socioeconomic History  . Marital status: Single    Spouse name: Not on file  . Number of children: Not on file  . Years of education: Not on file  . Highest education level: Not on file  Occupational History  . Not on file  Tobacco Use  . Smoking status: Never Smoker  . Smokeless tobacco: Never Used  Substance and Sexual Activity  . Alcohol use: Yes    Alcohol/week: 3.0 standard drinks    Types: 3 Cans of beer per week  . Drug use: No  . Sexual activity: Yes  Other Topics Concern  . Not on file  Social History Narrative  . Not on file   Social Determinants of Health   Financial Resource Strain: Not on file  Food  Insecurity: Not on file  Transportation Needs: Not on file  Physical Activity: Not on file  Stress: Not on file  Social Connections: Not on file  Intimate Partner Violence: Not on file    Past Surgical History:  Procedure Laterality Date  . ARTHROSCOPIC REPAIR ACL Left 2000  . FINGER AMPUTATION  2015   3rd finger left hand  . MENISCUS REPAIR Right 1990  . NAILBED REPAIR Left 04/16/2015   Procedure: nailbed excision left middle finger;  Surgeon: Hessie Knows, MD;  Location: [facility] ORS;  Service: Orthopedics;  Laterality: Left;  . THUMB ARTHROSCOPY Left 2014  . WRIST FUSION Right 2000    Family History  Problem Relation Age of Onset  . Hypertension Father   . Dementia Father   . Colon cancer Neg Hx     No Known Allergies  Current Outpatient Medications on File Prior to Visit  Medication Sig Dispense Refill  . COVID-19 mRNA vaccine, Pfizer, 30 MCG/0.3ML injection INJECT AS DIRECTED .3 mL 0  . levothyroxine (SYNTHROID) 100 MCG tablet TAKE 1 TABLET BY MOUTH ONCE DAILY 90 tablet 3  . metFORMIN (GLUCOPHAGE) 500 MG tablet Take 1 tablet (500 mg total) by mouth daily with breakfast. 90 tablet 3  . sildenafil (VIAGRA) 100 MG tablet TAKE 1 TABLET BY MOUTH 1 HOUR PRIOR TO SEX 10 tablet 4   Current Facility-Administered Medications on File Prior to Visit  Medication Dose Route Frequency Provider Last  Rate Last Admin  . 0.9 %  sodium chloride infusion  500 mL Intravenous Continuous Pyrtle, Lajuan Lines, MD        BP 122/65   Pulse (!) 54   Resp 20   Ht 6\' 5"  (1.956 m)   Wt 263 lb 12.8 oz (119.7 kg)   SpO2 99%   BMI 31.28 kg/m       Objective:   Physical Exam   General- No acute distress. Pleasant patient. Neck- Full range of motion, no jvd Lungs- Clear, even and unlabored. Heart- regular rate and rhythm. Neurologic- CNII- XII grossly intact. heent- negative. But both canals look swollen moderate. Small amount wax present. TM don't appear infected.     Assessment & Plan:  You  do have bilateral otitis externa on exam. Use cortisoprin otic drops. Stop using ear plugs and use noise blocking head phone. 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  If symptoms change or worsen let us know.  Follow 10 days or as needed  [employer], PA-C

## 2020-07-17 ENCOUNTER — Other Ambulatory Visit (HOSPITAL_BASED_OUTPATIENT_CLINIC_OR_DEPARTMENT_OTHER): Payer: Self-pay

## 2020-07-17 ENCOUNTER — Other Ambulatory Visit: Payer: Self-pay | Admitting: Medical

## 2020-07-17 MED ORDER — LEVOTHYROXINE SODIUM 100 MCG PO TABS
ORAL_TABLET | Freq: Every day | ORAL | 3 refills | Status: DC
  Filled 2020-07-17: qty 90, 90d supply, fill #0
  Filled 2020-11-06: qty 90, 90d supply, fill #1

## 2020-11-06 ENCOUNTER — Other Ambulatory Visit (HOSPITAL_BASED_OUTPATIENT_CLINIC_OR_DEPARTMENT_OTHER): Payer: Self-pay

## 2020-11-08 ENCOUNTER — Other Ambulatory Visit (HOSPITAL_BASED_OUTPATIENT_CLINIC_OR_DEPARTMENT_OTHER): Payer: Self-pay

## 2020-11-08 ENCOUNTER — Other Ambulatory Visit: Payer: Self-pay | Admitting: Medical

## 2020-11-08 MED ORDER — SILDENAFIL CITRATE 100 MG PO TABS
ORAL_TABLET | ORAL | 4 refills | Status: AC
  Filled 2020-11-08: qty 10, 10d supply, fill #0
  Filled 2021-02-24: qty 10, 10d supply, fill #1
  Filled 2021-04-15: qty 10, 10d supply, fill #2
  Filled 2021-05-19: qty 10, 10d supply, fill #3
  Filled 2021-07-29: qty 10, 10d supply, fill #4

## 2020-11-26 ENCOUNTER — Ambulatory Visit: Payer: Commercial Managed Care - PPO | Attending: Internal Medicine

## 2020-11-26 DIAGNOSIS — Z23 Encounter for immunization: Secondary | ICD-10-CM

## 2020-11-26 NOTE — Progress Notes (Signed)
   [id number]  Name:  Carlos Mccarty.    MRN: [id number] DOB: 14-May-1960  11/26/2020  [name] was observed post Covid-19 immunization for 15 minutes without incident. He was provided with Vaccine Information Sheet and instruction to access the V-Safe system.   [name] was instructed to call 911 with any severe reactions post vaccine: Difficulty breathing  Swelling of face and throat  A fast heartbeat  A bad rash all over body  Dizziness and weakness   Immunizations Administered     Name Date Dose VIS Date Route   Pfizer Covid-19 Vaccine Bivalent Booster 11/26/2020  9:18 AM 0.3 mL 09/18/2020 Intramuscular   Manufacturer: [place]   Lot: [id number]   [place]: [phone]

## 2020-12-16 ENCOUNTER — Other Ambulatory Visit (HOSPITAL_BASED_OUTPATIENT_CLINIC_OR_DEPARTMENT_OTHER): Payer: Self-pay

## 2020-12-16 MED ORDER — PFIZER COVID-19 VAC BIVALENT 30 MCG/0.3ML IM SUSP
INTRAMUSCULAR | 0 refills | Status: DC
  Filled 2020-12-16: qty 0.3, 1d supply, fill #0

## 2020-12-17 ENCOUNTER — Ambulatory Visit (INDEPENDENT_AMBULATORY_CARE_PROVIDER_SITE_OTHER): Payer: Commercial Managed Care - PPO | Admitting: Medical

## 2020-12-17 ENCOUNTER — Other Ambulatory Visit: Payer: Self-pay

## 2020-12-17 VITALS — BP 123/72 | HR 72 | Temp 98.2°F | Resp 18 | Ht 77.0 in | Wt 269.4 lb

## 2020-12-17 DIAGNOSIS — Z0000 Encounter for general adult medical examination without abnormal findings: Secondary | ICD-10-CM

## 2020-12-17 DIAGNOSIS — R739 Hyperglycemia, unspecified: Secondary | ICD-10-CM

## 2020-12-17 DIAGNOSIS — E039 Hypothyroidism, unspecified: Secondary | ICD-10-CM

## 2020-12-17 DIAGNOSIS — N529 Male erectile dysfunction, unspecified: Secondary | ICD-10-CM

## 2020-12-17 DIAGNOSIS — Z125 Encounter for screening for malignant neoplasm of prostate: Secondary | ICD-10-CM

## 2020-12-17 LAB — COMPREHENSIVE METABOLIC PANEL
ALT: 26 U/L (ref 0–53)
AST: 24 U/L (ref 0–37)
Albumin: 4.3 g/dL (ref 3.5–5.2)
Alkaline Phosphatase: 64 U/L (ref 39–117)
BUN: 18 mg/dL (ref 6–23)
CO2: 28 mEq/L (ref 19–32)
Calcium: 9.4 mg/dL (ref 8.4–10.5)
Chloride: 106 mEq/L (ref 96–112)
Creatinine, Ser: 1.44 mg/dL (ref 0.40–1.50)
GFR: 52.38 mL/min — ABNORMAL LOW (ref >60.00)
Glucose, Bld: 88 mg/dL (ref 70–99)
Potassium: 4.5 mEq/L (ref 3.5–5.1)
Sodium: 141 mEq/L (ref 135–145)
Total Bilirubin: 0.6 mg/dL (ref 0.2–1.2)
Total Protein: 7.2 g/dL (ref 6.0–8.3)

## 2020-12-17 LAB — CBC WITH DIFFERENTIAL/PLATELET
Basophils Absolute: 0.1 10*3/uL (ref 0.0–0.1)
Basophils Relative: 1.1 % (ref 0.0–3.0)
Eosinophils Absolute: 0.2 10*3/uL (ref 0.0–0.7)
Eosinophils Relative: 4.1 % (ref 0.0–5.0)
HCT: 43.1 % (ref 39.0–52.0)
Hemoglobin: 14 g/dL (ref 13.0–17.0)
Lymphocytes Relative: 34.1 % (ref 12.0–46.0)
Lymphs Abs: 1.9 10*3/uL (ref 0.7–4.0)
MCHC: 32.4 g/dL (ref 30.0–36.0)
MCV: 83.4 fl (ref 78.0–100.0)
Monocytes Absolute: 0.5 10*3/uL (ref 0.1–1.0)
Monocytes Relative: 9.2 % (ref 3.0–12.0)
Neutro Abs: 2.9 10*3/uL (ref 1.4–7.7)
Neutrophils Relative %: 51.5 % (ref 43.0–77.0)
Platelets: 227 10*3/uL (ref 150.0–400.0)
RBC: 5.17 Mil/uL (ref 4.22–5.81)
RDW: 14.5 % (ref 11.5–15.5)
WBC: 5.6 10*3/uL (ref 4.0–10.5)

## 2020-12-17 LAB — LIPID PANEL
Cholesterol: 196 mg/dL (ref 0–200)
HDL: 56.3 mg/dL (ref >39.00)
LDL Cholesterol: 123 mg/dL — ABNORMAL HIGH (ref 0–99)
NonHDL: 139.54
Total CHOL/HDL Ratio: 3
Triglycerides: 83 mg/dL (ref 0.0–149.0)
VLDL: 16.6 mg/dL (ref 0.0–40.0)

## 2020-12-17 LAB — TSH: TSH: 13.93 u[IU]/mL — ABNORMAL HIGH (ref 0.35–5.50)

## 2020-12-17 LAB — PSA: PSA: 0.96 ng/mL (ref 0.10–4.00)

## 2020-12-17 LAB — T4, FREE: Free T4: 0.64 ng/dL (ref 0.60–1.60)

## 2020-12-17 NOTE — Progress Notes (Signed)
Subjective:    Patient ID: Carlos Majors., male    DOB: [birth year], 60 y.o.   MRN: [id number]  HPI  Pt in for wellness exam.  Reports eating fairly healthy diet. Exercise walking his dog twice daily.  Denies smoking. Rare alcohol use. Working night shitt as sks. Some wt gain since last year.   2 years ago a1c was 6.5.  Up to date on flu vaccine. Presently declines pneumonia vaccine. Covid vaccine booster 11-26-20.  Advised to check to see if shingrix covered.  For colonoscopy repeat due in Dec 2022.   Hypothyroid. On levothyroxine 100 mcg daily.  ED. Recently refilled viagra and has refills.    Review of Systems  Constitutional:  Negative for chills, fatigue and fever.  HENT:  Negative for congestion, dental problem, ear discharge and ear pain.   Respiratory:  Negative for chest tightness, shortness of breath and wheezing.   Cardiovascular:  Negative for chest pain and palpitations.  Gastrointestinal:  Negative for abdominal pain.  Genitourinary:  Negative for dysuria, flank pain and frequency.       ED.  Musculoskeletal:  Negative for back pain, myalgias and neck pain.  Neurological:  Negative for dizziness, syncope, numbness and headaches.  Hematological:  Negative for adenopathy. Does not bruise/bleed easily.  Psychiatric/Behavioral:  Negative for behavioral problems and confusion.     Past Medical History:  Diagnosis Date   Thyroid disease    hypothyroidism     Social History   Socioeconomic History   Marital status: Single    Spouse name: Not on file   Number of children: Not on file   Years of education: Not on file   Highest education level: Not on file  Occupational History   Not on file  Tobacco Use   Smoking status: Never   Smokeless tobacco: Never  Substance and Sexual Activity   Alcohol use: Yes    Alcohol/week: 3.0 standard drinks    Types: 3 Cans of beer per week   Drug use: No   Sexual activity: Yes  Other Topics Concern   Not on file   Social History Narrative   Not on file   Social Determinants of Health   Financial Resource Strain: Not on file  Food Insecurity: Not on file  Transportation Needs: Not on file  Physical Activity: Not on file  Stress: Not on file  Social Connections: Not on file  Intimate Partner Violence: Not on file    Past Surgical History:  Procedure Laterality Date   ARTHROSCOPIC REPAIR ACL Left 2000   FINGER AMPUTATION  2015   3rd finger left hand   MENISCUS REPAIR Right 1990   NAILBED REPAIR Left 04/16/2015   Procedure: nailbed excision left middle finger;  Surgeon: Hessie Knows, MD;  Location: [facility] ORS;  Service: Orthopedics;  Laterality: Left;   THUMB ARTHROSCOPY Left 2014   WRIST FUSION Right 2000    Family History  Problem Relation Age of Onset   Hypertension Father    Dementia Father    Colon cancer Neg Hx     No Known Allergies  Current Outpatient Medications on File Prior to Visit  Medication Sig Dispense Refill   COVID-19 mRNA bivalent vaccine, Pfizer, (PFIZER COVID-19 VAC BIVALENT) injection Inject into the muscle. 0.3 mL 0   COVID-19 mRNA vaccine, Pfizer, 30 MCG/0.3ML injection INJECT AS DIRECTED .3 mL 0   levothyroxine (SYNTHROID) 100 MCG tablet Take 1 tablet by mouth daily. 90 tablet 3   metFORMIN (GLUCOPHAGE)  500 MG tablet Take 1 tablet (500 mg total) by mouth daily with breakfast. 90 tablet 3   neomycin-polymyxin-hydrocortisone (CORTISPORIN) OTIC solution Place 3 drops into both ears 4 (four) times daily. 10 mL 0   sildenafil (VIAGRA) 100 MG tablet TAKE 1 TABLET BY MOUTH 1 HOUR PRIOR TO SEX 10 tablet 4   Current Facility-Administered Medications on File Prior to Visit  Medication Dose Route Frequency Provider Last Rate Last Admin   0.9 %  sodium chloride infusion  500 mL Intravenous Continuous Pyrtle, Lajuan Lines, MD        BP 123/72   Pulse 72   Temp 98.2 F (36.8 C)   Resp 18   Ht 6\' 5"  (1.956 m)   Wt 269 lb 6.4 oz (122.2 kg)   SpO2 100%   BMI 31.95 kg/m        Objective:   Physical Exam  General Mental Status- Alert. General Appearance- Not in acute distress.   Skin General: Color- Normal Color. Moisture- Normal Moisture.  Neck Carotid Arteries- Normal color. Moisture- Normal Moisture. No carotid bruits. No JVD.  Chest and Lung Exam Auscultation: Breath Sounds:-Normal.  Cardiovascular Auscultation:Rythm- Regular. Murmurs & Other Heart Sounds:Auscultation of the heart reveals- No Murmurs.  Abdomen Inspection:-Inspeection Normal. Palpation/Percussion:Note:No mass. Palpation and Percussion of the abdomen reveal- Non Tender, Non Distended + BS, no rebound or guarding.   Neurologic Cranial Nerve exam:- CN III-XII intact(No nystagmus), symmetric smile. Strength:- 5/5 equal and symmetric strength both upper and lower extremities.       Assessment & Plan:   For you wellness exam today I have ordered cbc, cmp and  lipid panel.  Vaccine up to date. Consider pneumonia vaccine as we discussed.   Recommend exercise and healthy diet.  We will let you know lab results as they come in.  Follow up date appointment will be determined after lab review.    For elevated sugar get a1c today.  For hypothyroid recheck tsh and t4 level.  For ED you have viagra refills.  Reminder for you to call Dr. Hilarie Fredrickson GI MD office to repeat your colonoscopy as you are due in 12-2020. His office has likely already tried to contact you.  Mackie Pai, PA-C

## 2020-12-17 NOTE — Patient Instructions (Addendum)
For you wellness exam today I have ordered cbc, cmp and  lipid panel.  Vaccine up to date. Consider pneumonia vaccine as we discussed.   Recommend exercise and healthy diet.  We will let you know lab results as they come in.  Follow up date appointment will be determined after lab review.    For elevated sugar get a1c today.  For hypothyroid recheck tsh and t4 level.  For ED you have viagra refills.  Reminder for you to call Dr. Hilarie Fredrickson GI MD office to repeat your colonoscopy as you are due in 12-2020. His office has likely already tried to contact you.  Preventive Care 66-28 Years Old, Male Preventive care refers to lifestyle choices and visits with your health care provider that can promote health and wellness. Preventive care visits are also called wellness exams. What can I expect for my preventive care visit? Counseling During your preventive care visit, your health care provider may ask about your: Medical history, including: Past medical problems. Family medical history. Current health, including: Emotional well-being. Home life and relationship well-being. Sexual activity. Lifestyle, including: Alcohol, nicotine or tobacco, and drug use. Access to firearms. Diet, exercise, and sleep habits. Safety issues such as seatbelt and bike helmet use. Sunscreen use. Work and work Statistician. Physical exam Your health care provider will check your: Height and weight. These may be used to calculate your BMI (body mass index). BMI is a measurement that tells if you are at a healthy weight. Waist circumference. This measures the distance around your waistline. This measurement also tells if you are at a healthy weight and may help predict your risk of certain diseases, such as type 2 diabetes and high blood pressure. Heart rate and blood pressure. Body temperature. Skin for abnormal spots. What immunizations do I need? Vaccines are usually given at various ages, according to a  schedule. Your health care provider will recommend vaccines for you based on your age, medical history, and lifestyle or other factors, such as travel or where you work. What tests do I need? Screening Your health care provider may recommend screening tests for certain conditions. This may include: Lipid and cholesterol levels. Diabetes screening. This is done by checking your blood sugar (glucose) after you have not eaten for a while (fasting). Hepatitis B test. Hepatitis C test. HIV (human immunodeficiency virus) test. STI (sexually transmitted infection) testing, if you are at risk. Lung cancer screening. Prostate cancer screening. Colorectal cancer screening. Talk with your health care provider about your test results, treatment options, and if necessary, the need for more tests. Follow these instructions at home: Eating and drinking  Eat a diet that includes fresh fruits and vegetables, whole grains, lean protein, and low-fat dairy products. Take vitamin and mineral supplements as recommended by your health care provider. Do not drink alcohol if your health care provider tells you not to drink. If you drink alcohol: Limit how much you have to 0-2 drinks a day. Know how much alcohol is in your drink. In the [place] one drink equals one 12 oz bottle of beer (355 mL), one 5 oz glass of wine (148 mL), or one 1 oz glass of hard liquor (44 mL). Lifestyle Brush your teeth every morning and night with fluoride toothpaste. Floss one time each day. Exercise for at least 30 minutes 5 or more days each week. Do not use any products that contain nicotine or tobacco. These products include cigarettes, chewing tobacco, and vaping devices, such as e-cigarettes. If you  need help quitting, ask your health care provider. Do not use drugs. If you are sexually active, practice safe sex. Use a condom or other form of protection to prevent STIs. Take aspirin only as told by your health care provider. Make  sure that you understand how much to take and what form to take. Work with your health care provider to find out whether it is safe and beneficial for you to take aspirin daily. Find healthy ways to manage stress, such as: Meditation, yoga, or listening to music. Journaling. Talking to a trusted person. Spending time with friends and family. Minimize exposure to UV radiation to reduce your risk of skin cancer. Safety Always wear your seat belt while driving or riding in a vehicle. Do not drive: If you have been drinking alcohol. Do not ride with someone who has been drinking. When you are tired or distracted. While texting. If you have been using any mind-altering substances or drugs. Wear a helmet and other protective equipment during sports activities. If you have firearms in your house, make sure you follow all gun safety procedures. What's next? Go to your health care provider once a year for an annual wellness visit. Ask your health care provider how often you should have your eyes and teeth checked. Stay up to date on all vaccines. This information is not intended to replace advice given to you by your health care provider. Make sure you discuss any questions you have with your health care provider. Document Revised: 07/03/2020 Document Reviewed: 07/03/2020 Elsevier Patient Education  [place].

## 2020-12-18 LAB — HEMOGLOBIN A1C: Hgb A1c MFr Bld: 6.3 % (ref 4.6–6.5)

## 2020-12-19 MED ORDER — LEVOTHYROXINE SODIUM 112 MCG PO TABS
112.0000 ug | ORAL_TABLET | Freq: Every day | ORAL | 3 refills | Status: DC
  Filled 2020-12-19 – 2021-03-24 (×2): qty 90, 90d supply, fill #0
  Filled 2021-07-29: qty 90, 90d supply, fill #1
  Filled 2021-12-04: qty 90, 90d supply, fill #2

## 2020-12-19 NOTE — Addendum Note (Signed)
Addended by: Anabel Halon on: 12/19/2020 06:37 PM   Modules accepted: Orders

## 2020-12-20 ENCOUNTER — Other Ambulatory Visit (HOSPITAL_BASED_OUTPATIENT_CLINIC_OR_DEPARTMENT_OTHER): Payer: Self-pay

## 2020-12-24 ENCOUNTER — Encounter: Payer: Self-pay | Admitting: Internal Medicine

## 2020-12-27 ENCOUNTER — Other Ambulatory Visit (HOSPITAL_BASED_OUTPATIENT_CLINIC_OR_DEPARTMENT_OTHER): Payer: Self-pay

## 2021-02-24 ENCOUNTER — Other Ambulatory Visit (HOSPITAL_BASED_OUTPATIENT_CLINIC_OR_DEPARTMENT_OTHER): Payer: Self-pay

## 2021-03-24 ENCOUNTER — Other Ambulatory Visit (HOSPITAL_BASED_OUTPATIENT_CLINIC_OR_DEPARTMENT_OTHER): Payer: Self-pay

## 2021-04-15 ENCOUNTER — Other Ambulatory Visit (HOSPITAL_BASED_OUTPATIENT_CLINIC_OR_DEPARTMENT_OTHER): Payer: Self-pay

## 2021-05-19 ENCOUNTER — Other Ambulatory Visit (HOSPITAL_BASED_OUTPATIENT_CLINIC_OR_DEPARTMENT_OTHER): Payer: Self-pay

## 2021-07-30 ENCOUNTER — Other Ambulatory Visit (HOSPITAL_BASED_OUTPATIENT_CLINIC_OR_DEPARTMENT_OTHER): Payer: Self-pay

## 2021-12-05 ENCOUNTER — Other Ambulatory Visit (HOSPITAL_BASED_OUTPATIENT_CLINIC_OR_DEPARTMENT_OTHER): Payer: Self-pay

## 2022-01-16 ENCOUNTER — Ambulatory Visit (INDEPENDENT_AMBULATORY_CARE_PROVIDER_SITE_OTHER): Payer: BLUE CROSS/BLUE SHIELD | Admitting: Medical

## 2022-01-16 VITALS — BP 116/70 | HR 77 | Temp 98.0°F | Resp 18 | Ht 77.0 in | Wt 273.6 lb

## 2022-01-16 DIAGNOSIS — Z1211 Encounter for screening for malignant neoplasm of colon: Secondary | ICD-10-CM | POA: Diagnosis not present

## 2022-01-16 DIAGNOSIS — Z0000 Encounter for general adult medical examination without abnormal findings: Secondary | ICD-10-CM

## 2022-01-16 DIAGNOSIS — E039 Hypothyroidism, unspecified: Secondary | ICD-10-CM

## 2022-01-16 DIAGNOSIS — Z23 Encounter for immunization: Secondary | ICD-10-CM | POA: Diagnosis not present

## 2022-01-16 DIAGNOSIS — Z125 Encounter for screening for malignant neoplasm of prostate: Secondary | ICD-10-CM | POA: Diagnosis not present

## 2022-01-16 DIAGNOSIS — E785 Hyperlipidemia, unspecified: Secondary | ICD-10-CM

## 2022-01-16 DIAGNOSIS — R739 Hyperglycemia, unspecified: Secondary | ICD-10-CM

## 2022-01-16 LAB — CBC WITH DIFFERENTIAL/PLATELET
Basophils Absolute: 0.1 10*3/uL (ref 0.0–0.1)
Basophils Relative: 0.9 % (ref 0.0–3.0)
Eosinophils Absolute: 0.2 10*3/uL (ref 0.0–0.7)
Eosinophils Relative: 3.6 % (ref 0.0–5.0)
HCT: 43.1 % (ref 39.0–52.0)
Hemoglobin: 14.2 g/dL (ref 13.0–17.0)
Lymphocytes Relative: 40 % (ref 12.0–46.0)
Lymphs Abs: 2.3 10*3/uL (ref 0.7–4.0)
MCHC: 32.9 g/dL (ref 30.0–36.0)
MCV: 82.6 fl (ref 78.0–100.0)
Monocytes Absolute: 0.5 10*3/uL (ref 0.1–1.0)
Monocytes Relative: 8.9 % (ref 3.0–12.0)
Neutro Abs: 2.7 10*3/uL (ref 1.4–7.7)
Neutrophils Relative %: 46.6 % (ref 43.0–77.0)
Platelets: 260 10*3/uL (ref 150.0–400.0)
RBC: 5.22 Mil/uL (ref 4.22–5.81)
RDW: 14.3 % (ref 11.5–15.5)
WBC: 5.9 10*3/uL (ref 4.0–10.5)

## 2022-01-16 LAB — T4, FREE: Free T4: 0.71 ng/dL (ref 0.60–1.60)

## 2022-01-16 LAB — COMPREHENSIVE METABOLIC PANEL
ALT: 25 U/L (ref 0–53)
AST: 25 U/L (ref 0–37)
Albumin: 4.5 g/dL (ref 3.5–5.2)
Alkaline Phosphatase: 71 U/L (ref 39–117)
BUN: 14 mg/dL (ref 6–23)
CO2: 28 mEq/L (ref 19–32)
Calcium: 9.2 mg/dL (ref 8.4–10.5)
Chloride: 105 mEq/L (ref 96–112)
Creatinine, Ser: 1.37 mg/dL (ref 0.40–1.50)
GFR: 55.19 mL/min — ABNORMAL LOW (ref >60.00)
Glucose, Bld: 86 mg/dL (ref 70–99)
Potassium: 4 mEq/L (ref 3.5–5.1)
Sodium: 140 mEq/L (ref 135–145)
Total Bilirubin: 0.7 mg/dL (ref 0.2–1.2)
Total Protein: 7.4 g/dL (ref 6.0–8.3)

## 2022-01-16 LAB — LIPID PANEL
Cholesterol: 195 mg/dL (ref 0–200)
HDL: 53.6 mg/dL (ref >39.00)
LDL Cholesterol: 124 mg/dL — ABNORMAL HIGH (ref 0–99)
NonHDL: 141.37
Total CHOL/HDL Ratio: 4
Triglycerides: 89 mg/dL (ref 0.0–149.0)
VLDL: 17.8 mg/dL (ref 0.0–40.0)

## 2022-01-16 LAB — PSA: PSA: 1.08 ng/mL (ref 0.10–4.00)

## 2022-01-16 LAB — TSH: TSH: 23.18 u[IU]/mL — ABNORMAL HIGH (ref 0.35–5.50)

## 2022-01-16 LAB — HEMOGLOBIN A1C: Hgb A1c MFr Bld: 6.5 % (ref 4.6–6.5)

## 2022-01-16 NOTE — Addendum Note (Signed)
Addended by: Jeronimo Greaves on: 01/16/2022 01:45 PM   Modules accepted: Orders

## 2022-01-16 NOTE — Patient Instructions (Addendum)
For you wellness exam today I have ordered cbc, cmp, psa and lipid panel.  Flu vaccine today. Shingrix vaccine today.  For hyothyroid- tsh and t4.  For elevated sugar get A1c.  Referral to GI MD placed for colonoscopy. Please call the office since you appear overdue.  Recommend exercise and healthy diet.  We will let you know lab results as they come in.  Follow up date appointment will be determined after lab review.    Preventive Care 93-62 Years Old, Male Preventive care refers to lifestyle choices and visits with your health care provider that can promote health and wellness. Preventive care visits are also called wellness exams. What can I expect for my preventive care visit? Counseling During your preventive care visit, your health care provider may ask about your: Medical history, including: Past medical problems. Family medical history. Current health, including: Emotional well-being. Home life and relationship well-being. Sexual activity. Lifestyle, including: Alcohol, nicotine or tobacco, and drug use. Access to firearms. Diet, exercise, and sleep habits. Safety issues such as seatbelt and bike helmet use. Sunscreen use. Work and work Statistician. Physical exam Your health care provider will check your: Height and weight. These may be used to calculate your BMI (body mass index). BMI is a measurement that tells if you are at a healthy weight. Waist circumference. This measures the distance around your waistline. This measurement also tells if you are at a healthy weight and may help predict your risk of certain diseases, such as type 2 diabetes and high blood pressure. Heart rate and blood pressure. Body temperature. Skin for abnormal spots. What immunizations do I need?  Vaccines are usually given at various ages, according to a schedule. Your health care provider will recommend vaccines for you based on your age, medical history, and lifestyle or other factors,  such as travel or where you work. What tests do I need? Screening Your health care provider may recommend screening tests for certain conditions. This may include: Lipid and cholesterol levels. Diabetes screening. This is done by checking your blood sugar (glucose) after you have not eaten for a while (fasting). Hepatitis B test. Hepatitis C test. HIV (human immunodeficiency virus) test. STI (sexually transmitted infection) testing, if you are at risk. Lung cancer screening. Prostate cancer screening. Colorectal cancer screening. Talk with your health care provider about your test results, treatment options, and if necessary, the need for more tests. Follow these instructions at home: Eating and drinking  Eat a diet that includes fresh fruits and vegetables, whole grains, lean protein, and low-fat dairy products. Take vitamin and mineral supplements as recommended by your health care provider. Do not drink alcohol if your health care provider tells you not to drink. If you drink alcohol: Limit how much you have to 0-2 drinks a day. Know how much alcohol is in your drink. In the [place] one drink equals one 12 oz bottle of beer (355 mL), one 5 oz glass of wine (148 mL), or one 1 oz glass of hard liquor (44 mL). Lifestyle Brush your teeth every morning and night with fluoride toothpaste. Floss one time each day. Exercise for at least 30 minutes 5 or more days each week. Do not use any products that contain nicotine or tobacco. These products include cigarettes, chewing tobacco, and vaping devices, such as e-cigarettes. If you need help quitting, ask your health care provider. Do not use drugs. If you are sexually active, practice safe sex. Use a condom or other form of protection  to prevent STIs. Take aspirin only as told by your health care provider. Make sure that you understand how much to take and what form to take. Work with your health care provider to find out whether it is safe and  beneficial for you to take aspirin daily. Find healthy ways to manage stress, such as: Meditation, yoga, or listening to music. Journaling. Talking to a trusted person. Spending time with friends and family. Minimize exposure to UV radiation to reduce your risk of skin cancer. Safety Always wear your seat belt while driving or riding in a vehicle. Do not drive: If you have been drinking alcohol. Do not ride with someone who has been drinking. When you are tired or distracted. While texting. If you have been using any mind-altering substances or drugs. Wear a helmet and other protective equipment during sports activities. If you have firearms in your house, make sure you follow all gun safety procedures. What's next? Go to your health care provider once a year for an annual wellness visit. Ask your health care provider how often you should have your eyes and teeth checked. Stay up to date on all vaccines. This information is not intended to replace advice given to you by your health care provider. Make sure you discuss any questions you have with your health care provider. Document Revised: 07/03/2020 Document Reviewed: 07/03/2020 Elsevier Patient Education  [place].

## 2022-01-16 NOTE — Progress Notes (Signed)
Subjective:    Patient ID: Carlos Majors., male    DOB: November 13, 1960, 61 y.o.   MRN: [id number]  HPI  Pt in for wellness exam.   Reports not eating real healthy. Exercise walking his dog twice daily.  Denies smoking. Rare alcohol use. Working night shift as sks. Some wt gain since last year.    Pt just got flu vaccine. He agrees to shingles vaccine today. Overdue for repeat colonoscopy.    Review of Systems  Constitutional:  Negative for chills, fatigue and fever.  HENT:  Negative for congestion, drooling and ear pain.   Respiratory:  Negative for cough, chest tightness and wheezing.   Cardiovascular:  Negative for chest pain and palpitations.  Gastrointestinal:  Negative for abdominal pain, blood in stool and nausea.  Musculoskeletal:  Negative for back pain, joint swelling and neck pain.  Neurological:  Negative for dizziness, seizures, syncope, weakness and headaches.  Hematological:  Negative for adenopathy. Does not bruise/bleed easily.  Psychiatric/Behavioral:  Negative for behavioral problems and confusion.    Past Medical History:  Diagnosis Date   Thyroid disease    hypothyroidism     Social History   Socioeconomic History   Marital status: Single    Spouse name: Not on file   Number of children: Not on file   Years of education: Not on file   Highest education level: Not on file  Occupational History   Not on file  Tobacco Use   Smoking status: Never   Smokeless tobacco: Never  Substance and Sexual Activity   Alcohol use: Yes    Alcohol/week: 3.0 standard drinks of alcohol    Types: 3 Cans of beer per week   Drug use: No   Sexual activity: Yes  Other Topics Concern   Not on file  Social History Narrative   Not on file   Social Determinants of Health   Financial Resource Strain: Not on file  Food Insecurity: Not on file  Transportation Needs: Not on file  Physical Activity: Not on file  Stress: Not on file  Social Connections: Not on file   Intimate Partner Violence: Not on file    Past Surgical History:  Procedure Laterality Date   ARTHROSCOPIC REPAIR ACL Left 2000   FINGER AMPUTATION  2015   3rd finger left hand   MENISCUS REPAIR Right 1990   NAILBED REPAIR Left 04/16/2015   Procedure: nailbed excision left middle finger;  Surgeon: Hessie Knows, MD;  Location: [facility] ORS;  Service: Orthopedics;  Laterality: Left;   THUMB ARTHROSCOPY Left 2014   WRIST FUSION Right 2000    Family History  Problem Relation Age of Onset   Hypertension Father    Dementia Father    Colon cancer Neg Hx     No Known Allergies  Current Outpatient Medications on File Prior to Visit  Medication Sig Dispense Refill   levothyroxine (SYNTHROID) 112 MCG tablet Take 1 tablet (112 mcg total) by mouth daily. 90 tablet 3   metFORMIN (GLUCOPHAGE) 500 MG tablet Take 1 tablet (500 mg total) by mouth daily with breakfast. 90 tablet 3   sildenafil (VIAGRA) 100 MG tablet TAKE 1 TABLET BY MOUTH 1 HOUR PRIOR TO SEX 10 tablet 4   Current Facility-Administered Medications on File Prior to Visit  Medication Dose Route Frequency Provider Last Rate Last Admin   0.9 %  sodium chloride infusion  500 mL Intravenous Continuous Pyrtle, Lajuan Lines, MD        BP  116/70   Pulse 77   Temp 98 F (36.7 C)   Resp 18   Ht '6\' 5"'$  (1.956 m)   Wt 273 lb 9.6 oz (124.1 kg)   SpO2 99%   BMI 32.44 kg/m         Objective:   Physical Exam  General Mental Status- Alert. General Appearance- Not in acute distress.   Skin General: Color- Normal Color. Moisture- Normal Moisture.  Neck Carotid Arteries- Normal color. Moisture- Normal Moisture. No carotid bruits. No JVD.  Chest and Lung Exam Auscultation: Breath Sounds:-Normal.  Cardiovascular Auscultation:Rythm- Regular. Murmurs & Other Heart Sounds:Auscultation of the heart reveals- No Murmurs.  Abdomen Inspection:-Inspeection Normal. Palpation/Percussion:Note:No mass. Palpation and Percussion of the abdomen  reveal- Non Tender, Non Distended + BS, no rebound or guarding.    Neurologic Cranial Nerve exam:- CN III-XII intact(No nystagmus), symmetric smile. Drift Test:- No drift. Romberg Exam:- Negative.  Heal to Toe Gait exam:-Normal. Finger to Nose:- Normal/Intact Strength:- 5/5 equal and symmetric strength both upper and lower extremities.       Assessment & Plan:   Patient Instructions  For you wellness exam today I have ordered cbc, cmp,psa and lipid panel.  Flu vaccine today. Shingrix vaccine today.  For hyothyroid- tsh and t4.  For elevated sugar get A1c.  Referral to GI MD placed for colonosocopy. Please call the office since you appear overdue.  Recommend exercise and healthy diet.  We will let you know lab results as they come in.  Follow up date appointment will be determined after lab review.       Mackie Pai, PA-C

## 2022-01-17 ENCOUNTER — Other Ambulatory Visit: Payer: Self-pay

## 2022-01-17 ENCOUNTER — Other Ambulatory Visit (HOSPITAL_BASED_OUTPATIENT_CLINIC_OR_DEPARTMENT_OTHER): Payer: Self-pay

## 2022-01-17 ENCOUNTER — Encounter: Payer: Self-pay | Admitting: Medical

## 2022-01-17 MED ORDER — ATORVASTATIN CALCIUM 10 MG PO TABS
10.0000 mg | ORAL_TABLET | Freq: Every day | ORAL | 3 refills | Status: AC
  Filled 2022-01-17: qty 30, 30d supply, fill #0

## 2022-01-17 NOTE — Addendum Note (Signed)
Addended by: Anabel Halon on: 01/17/2022 08:26 AM   Modules accepted: Orders

## 2022-01-19 ENCOUNTER — Other Ambulatory Visit (HOSPITAL_BASED_OUTPATIENT_CLINIC_OR_DEPARTMENT_OTHER): Payer: Self-pay

## 2022-01-20 ENCOUNTER — Encounter: Payer: Self-pay | Admitting: Internal Medicine

## 2022-01-29 ENCOUNTER — Other Ambulatory Visit (HOSPITAL_BASED_OUTPATIENT_CLINIC_OR_DEPARTMENT_OTHER): Payer: Self-pay

## 2022-02-09 ENCOUNTER — Telehealth: Payer: Self-pay

## 2022-02-09 ENCOUNTER — Ambulatory Visit: Payer: BLUE CROSS/BLUE SHIELD

## 2022-02-09 NOTE — Telephone Encounter (Signed)
Patient did not call back to reschedule pre visit with nurse.  For this reason the colonoscopy was cancelled

## 2022-02-09 NOTE — Telephone Encounter (Signed)
Called patient twice with no answer.  Left message for patient to call back and reschedule Previst by 5pm today.

## 2022-02-17 ENCOUNTER — Ambulatory Visit (AMBULATORY_SURGERY_CENTER): Payer: BLUE CROSS/BLUE SHIELD

## 2022-02-17 ENCOUNTER — Other Ambulatory Visit (HOSPITAL_BASED_OUTPATIENT_CLINIC_OR_DEPARTMENT_OTHER): Payer: Self-pay

## 2022-02-17 VITALS — Ht 77.0 in | Wt 270.0 lb

## 2022-02-17 DIAGNOSIS — Z86010 Personal history of colonic polyps: Secondary | ICD-10-CM

## 2022-02-17 MED ORDER — NA SULFATE-K SULFATE-MG SULF 17.5-3.13-1.6 GM/177ML PO SOLN
1.0000 | Freq: Once | ORAL | 0 refills | Status: AC
  Filled 2022-02-17: qty 354, 1d supply, fill #0

## 2022-02-17 NOTE — Progress Notes (Signed)

## 2022-02-20 ENCOUNTER — Encounter: Payer: Self-pay | Admitting: Internal Medicine

## 2022-03-02 ENCOUNTER — Encounter: Payer: Self-pay | Admitting: Internal Medicine

## 2022-03-02 ENCOUNTER — Encounter: Payer: BLUE CROSS/BLUE SHIELD | Admitting: Internal Medicine

## 2022-03-02 ENCOUNTER — Ambulatory Visit (AMBULATORY_SURGERY_CENTER): Payer: BLUE CROSS/BLUE SHIELD | Admitting: Internal Medicine

## 2022-03-02 VITALS — BP 112/65 | HR 57 | Temp 98.9°F | Resp 22 | Ht 77.0 in | Wt 270.0 lb

## 2022-03-02 DIAGNOSIS — Z86010 Personal history of colonic polyps: Secondary | ICD-10-CM | POA: Diagnosis not present

## 2022-03-02 DIAGNOSIS — D122 Benign neoplasm of ascending colon: Secondary | ICD-10-CM

## 2022-03-02 DIAGNOSIS — D124 Benign neoplasm of descending colon: Secondary | ICD-10-CM | POA: Diagnosis not present

## 2022-03-02 DIAGNOSIS — Z09 Encounter for follow-up examination after completed treatment for conditions other than malignant neoplasm: Secondary | ICD-10-CM

## 2022-03-02 DIAGNOSIS — K635 Polyp of colon: Secondary | ICD-10-CM | POA: Diagnosis not present

## 2022-03-02 MED ORDER — SODIUM CHLORIDE 0.9 % IV SOLN: 500.0000 mL | Freq: Once | INTRAVENOUS | Status: DC

## 2022-03-02 NOTE — Progress Notes (Signed)
GASTROENTEROLOGY PROCEDURE H&P NOTE   Primary Care Physician: Mackie Pai, PA-C    Reason for Procedure:  History of SSP  Plan:    Surveillance colonoscopy  Patient is appropriate for endoscopic procedure(s) in the ambulatory ([facility]) setting.  The nature of the procedure, as well as the risks, benefits, and alternatives were carefully and thoroughly reviewed with the patient. Ample time for discussion and questions allowed. The patient understood, was satisfied, and agreed to proceed.     HPI: Carlos Mccarty. is a 62 y.o. male who presents for surveillance colonoscopy.  Medical history as below.  Tolerated the prep.  No recent chest pain or shortness of breath.  No abdominal pain today.  Past Medical History:  Diagnosis Date   Thyroid disease    hypothyroidism    Past Surgical History:  Procedure Laterality Date   ARTHROSCOPIC REPAIR ACL Left 2000   COLONOSCOPY     FINGER AMPUTATION  2015   3rd finger left hand   MENISCUS REPAIR Right 1990   NAILBED REPAIR Left 04/16/2015   Procedure: nailbed excision left middle finger;  Surgeon: Hessie Knows, MD;  Location: [facility] ORS;  Service: Orthopedics;  Laterality: Left;   THUMB ARTHROSCOPY Left 2014   WRIST FUSION Right 2000    Prior to Admission medications   Medication Sig Start Date End Date Taking? Authorizing Provider  levothyroxine (SYNTHROID) 112 MCG tablet Take 1 tablet (112 mcg total) by mouth daily. 12/19/20  Yes Saguier, Percell Miller, PA-C  atorvastatin (LIPITOR) 10 MG tablet Take 1 tablet (10 mg total) by mouth daily. Patient not taking: Reported on 03/02/2022 01/17/22   Saguier, Percell Miller, PA-C  metFORMIN (GLUCOPHAGE) 500 MG tablet Take 1 tablet (500 mg total) by mouth daily with breakfast. Patient not taking: Reported on 02/17/2022 11/07/18   Saguier, Percell Miller, PA-C  sildenafil (VIAGRA) 100 MG tablet TAKE 1 TABLET BY MOUTH 1 HOUR PRIOR TO SEX 11/08/20 11/08/21  Saguier, Percell Miller, PA-C    Current Outpatient Medications   Medication Sig Dispense Refill   levothyroxine (SYNTHROID) 112 MCG tablet Take 1 tablet (112 mcg total) by mouth daily. 90 tablet 3   atorvastatin (LIPITOR) 10 MG tablet Take 1 tablet (10 mg total) by mouth daily. (Patient not taking: Reported on 03/02/2022) 30 tablet 3   metFORMIN (GLUCOPHAGE) 500 MG tablet Take 1 tablet (500 mg total) by mouth daily with breakfast. (Patient not taking: Reported on 02/17/2022) 90 tablet 3   sildenafil (VIAGRA) 100 MG tablet TAKE 1 TABLET BY MOUTH 1 HOUR PRIOR TO SEX 10 tablet 4   Current Facility-Administered Medications  Medication Dose Route Frequency Provider Last Rate Last Admin   0.9 %  sodium chloride infusion  500 mL Intravenous Continuous Shannon Balthazar, Lajuan Lines, MD       0.9 %  sodium chloride infusion  500 mL Intravenous Once Lataya Varnell, Lajuan Lines, MD        Allergies as of 03/02/2022   (No Known Allergies)    Family History  Problem Relation Age of Onset   Hypertension Father    Dementia Father    Colon cancer Neg Hx    Colon polyps Neg Hx    Esophageal cancer Neg Hx    Rectal cancer Neg Hx    Stomach cancer Neg Hx     Social History   Socioeconomic History   Marital status: Single    Spouse name: Not on file   Number of children: Not on file   Years of education: Not on file  Highest education level: Not on file  Occupational History   Not on file  Tobacco Use   Smoking status: Never   Smokeless tobacco: Never  Vaping Use   Vaping Use: Never used  Substance and Sexual Activity   Alcohol use: Yes    Alcohol/week: 3.0 standard drinks of alcohol    Types: 3 Cans of beer per week   Drug use: No   Sexual activity: Yes  Other Topics Concern   Not on file  Social History Narrative   Not on file   Social Determinants of Health   Financial Resource Strain: Not on file  Food Insecurity: Not on file  Transportation Needs: Not on file  Physical Activity: Not on file  Stress: Not on file  Social Connections: Not on file  Intimate Partner  Violence: Not on file    Physical Exam: Vital signs in last 24 hours: @BP$  (!) 134/90   Pulse 75   Temp 98.9 F (37.2 C) (Temporal)   Resp 17   Ht 6' 5"$  (1.956 m)   Wt 270 lb (122.5 kg)   SpO2 99%   BMI 32.02 kg/m  GEN: NAD EYE: Sclerae anicteric ENT: MMM CV: Non-tachycardic Pulm: CTA b/l GI: Soft, NT/ND NEURO:  Alert & [facility]ed x 3   Zenovia Jarred, MD [facility]  03/02/2022 11:27 AM

## 2022-03-02 NOTE — Progress Notes (Signed)
Called to room to assist during endoscopic procedure.  Patient ID and intended procedure confirmed with present staff. Received instructions for my participation in the procedure from the performing physician.  

## 2022-03-02 NOTE — Progress Notes (Signed)
Report to pacu rn. Vss. Care resumed by rn. 

## 2022-03-02 NOTE — Progress Notes (Signed)
VS completed by DT.  Pt's states no medical or surgical changes since previsit or office visit.  

## 2022-03-02 NOTE — Op Note (Signed)
[place] Patient Name: Zadrian Roddey Procedure Date: 03/02/2022 11:11 AM MRN: [id number] Endoscopist: Jerene Bears , MD, [id number] Age: 62 Referring MD:  Date of Birth: 1960-01-25 Gender: Male Account #: [id number] Procedure:                Colonoscopy Indications:              High risk colon cancer surveillance: Personal                            history of sessile serrated colon polyp (less than                            10 mm in size) with no dysplasia, last exam Dec 2017 Medicines:                Monitored Anesthesia Care Procedure:                Pre-Anesthesia Assessment:                           - Prior to the procedure, a History and Physical                            was performed, and patient medications and                            allergies were reviewed. The patient's tolerance of                            previous anesthesia was also reviewed. The risks                            and benefits of the procedure and the sedation                            options and risks were discussed with the patient.                            All questions were answered, and informed consent                            was obtained. Prior Anticoagulants: The patient has                            taken no anticoagulant or antiplatelet agents. ASA                            Grade Assessment: II - A patient with mild systemic                            disease. After reviewing the risks and benefits,                            the patient was deemed in satisfactory condition to  undergo the procedure.                           After obtaining informed consent, the colonoscope                            was passed under direct vision. Throughout the                            procedure, the patient's blood pressure, pulse, and                            oxygen saturations were monitored continuously. The                            CF [id number]  [id number] was introduced through the anus                            and advanced to the cecum, identified by                            appendiceal orifice and ileocecal valve. The                            colonoscopy was performed without difficulty. The                            patient tolerated the procedure well. The quality                            of the bowel preparation was good. The ileocecal                            valve, appendiceal orifice, and rectum were                            photographed. Scope In: 11:29:46 AM Scope Out: [id number] AM Scope Withdrawal Time: 0 hours 16 minutes 19 seconds  Total Procedure Duration: 0 hours 18 minutes 32 seconds  Findings:                 The digital rectal exam was normal.                           A 6 mm polyp was found in the ascending colon. The                            polyp was flat. The polyp was removed with a cold                            snare. Resection and retrieval were complete.                           A 9 mm polyp was found in the descending colon. The  polyp was semi-pedunculated. The polyp was removed                            with a cold snare. Resection and retrieval were                            complete.                           Internal hemorrhoids were found during                            retroflexion. The hemorrhoids were medium-sized. Complications:            No immediate complications. Estimated Blood Loss:     Estimated blood loss was minimal. Impression:               - One 6 mm polyp in the ascending colon, removed                            with a cold snare. Resected and retrieved.                           - One 9 mm polyp in the descending colon, removed                            with a cold snare. Resected and retrieved.                           - Internal hemorrhoids. Recommendation:           - Patient has a contact number available for                             emergencies. The signs and symptoms of potential                            delayed complications were discussed with the                            patient. Return to normal activities tomorrow.                            Written discharge instructions were provided to the                            patient.                           - Resume previous diet.                           - Continue present medications.                           - Await pathology results.                           -  Repeat colonoscopy is recommended for                            surveillance. The colonoscopy date will be                            determined after pathology results from today's                            exam become available for review. Jerene Bears, MD 03/02/2022 11:53:13 AM This report has been signed electronically.

## 2022-03-02 NOTE — Patient Instructions (Signed)
YOU HAD AN ENDOSCOPIC PROCEDURE TODAY: Refer to the procedure report and other information in the discharge instructions given to you for any specific questions about what was found during the examination. If this information does not answer your questions, please call [place] office at [phone] to clarify.   YOU SHOULD EXPECT: Some feelings of bloating in the abdomen. Passage of more gas than usual. Walking can help get rid of the air that was put into your GI tract during the procedure and reduce the bloating. If you had a lower endoscopy (such as a colonoscopy or flexible sigmoidoscopy) you may notice spotting of blood in your stool or on the toilet paper. Some abdominal soreness may be present for a day or two, also.  DIET: Your first meal following the procedure should be a light meal and then it is ok to progress to your normal diet. A half-sandwich or bowl of soup is an example of a good first meal. Heavy or fried foods are harder to digest and may make you feel nauseous or bloated. Drink plenty of fluids but you should avoid alcoholic beverages for 24 hours. If you had a esophageal dilation, please see attached instructions for diet.    ACTIVITY: Your care partner should take you home directly after the procedure. You should plan to take it easy, moving slowly for the rest of the day. You can resume normal activity the day after the procedure however YOU SHOULD NOT DRIVE, use power tools, machinery or perform tasks that involve climbing or major physical exertion for 24 hours (because of the sedation medicines used during the test).   SYMPTOMS TO REPORT IMMEDIATELY: A gastroenterologist can be reached at any hour. Please call [phone]  for any of the following symptoms:  Following lower endoscopy (colonoscopy, flexible sigmoidoscopy) Excessive amounts of blood in the stool  Significant tenderness, worsening of abdominal pains  Swelling of the abdomen that is new, acute  Fever of 100 or  higher  Following upper endoscopy (EGD, EUS, ERCP, esophageal dilation) Vomiting of blood or coffee ground material  New, significant abdominal pain  New, significant chest pain or pain under the shoulder blades  Painful or persistently difficult swallowing  New shortness of breath  Black, tarry-looking or red, bloody stools  FOLLOW UP:  If any biopsies were taken you will be contacted by phone or by letter within the next 1-3 weeks. Call [phone]  if you have not heard about the biopsies in 3 weeks.  Please also call with any specific questions about appointments or follow up tests.YOU HAD AN ENDOSCOPIC PROCEDURE TODAY: Refer to the procedure report and other information in the discharge instructions given to you for any specific questions about what was found during the examination. If this information does not answer your questions, please call [place] office at [phone] to clarify.   YOU SHOULD EXPECT: Some feelings of bloating in the abdomen. Passage of more gas than usual. Walking can help get rid of the air that was put into your GI tract during the procedure and reduce the bloating. If you had a lower endoscopy (such as a colonoscopy or flexible sigmoidoscopy) you may notice spotting of blood in your stool or on the toilet paper. Some abdominal soreness may be present for a day or two, also.  DIET: Your first meal following the procedure should be a light meal and then it is ok to progress to your normal diet. A half-sandwich or bowl of soup is an example of a   good first meal. Heavy or fried foods are harder to digest and may make you feel nauseous or bloated. Drink plenty of fluids but you should avoid alcoholic beverages for 24 hours. If you had a esophageal dilation, please see attached instructions for diet.    ACTIVITY: Your care partner should take you home directly after the procedure. You should plan to take it easy, moving slowly for the rest of the day. You can resume  normal activity the day after the procedure however YOU SHOULD NOT DRIVE, use power tools, machinery or perform tasks that involve climbing or major physical exertion for 24 hours (because of the sedation medicines used during the test).   SYMPTOMS TO REPORT IMMEDIATELY: A gastroenterologist can be reached at any hour. Please call [phone]  for any of the following symptoms:  Following lower endoscopy (colonoscopy, flexible sigmoidoscopy) Excessive amounts of blood in the stool  Significant tenderness, worsening of abdominal pains  Swelling of the abdomen that is new, acute  Fever of 100 or higher   FOLLOW UP:  If any biopsies were taken you will be contacted by phone or by letter within the next 1-3 weeks. Call [phone]  if you have not heard about the biopsies in 3 weeks.  Please also call with any specific questions about appointments or follow up tests.

## 2022-03-03 ENCOUNTER — Telehealth: Payer: Self-pay

## 2022-03-03 NOTE — Telephone Encounter (Signed)
  Follow up Call-     03/02/2022   11:10 AM  Call back number  Post procedure Call Back phone  # [phone]  Permission to leave phone message Yes     Left message

## 2022-03-05 ENCOUNTER — Encounter: Payer: Self-pay | Admitting: Internal Medicine

## 2022-04-20 ENCOUNTER — Other Ambulatory Visit: Payer: Self-pay | Admitting: Medical

## 2022-04-20 ENCOUNTER — Other Ambulatory Visit (HOSPITAL_BASED_OUTPATIENT_CLINIC_OR_DEPARTMENT_OTHER): Payer: Self-pay

## 2022-04-20 MED ORDER — LEVOTHYROXINE SODIUM 112 MCG PO TABS
112.0000 ug | ORAL_TABLET | Freq: Every day | ORAL | 3 refills | Status: DC
  Filled 2022-04-20: qty 90, 90d supply, fill #0
  Filled 2022-08-25: qty 90, 90d supply, fill #1
  Filled 2022-12-08: qty 90, 90d supply, fill #2
  Filled 2023-03-16: qty 90, 90d supply, fill #3

## 2022-08-26 ENCOUNTER — Other Ambulatory Visit: Payer: Self-pay

## 2023-01-18 ENCOUNTER — Encounter: Payer: BLUE CROSS/BLUE SHIELD | Admitting: Medical

## 2023-02-24 ENCOUNTER — Encounter: Payer: Self-pay | Admitting: Medical

## 2023-02-24 ENCOUNTER — Ambulatory Visit (INDEPENDENT_AMBULATORY_CARE_PROVIDER_SITE_OTHER): Payer: BLUE CROSS/BLUE SHIELD | Admitting: Medical

## 2023-02-24 VITALS — BP 110/72 | HR 71 | Temp 98.0°F | Resp 16 | Ht 77.0 in | Wt 272.2 lb

## 2023-02-24 DIAGNOSIS — Z125 Encounter for screening for malignant neoplasm of prostate: Secondary | ICD-10-CM | POA: Diagnosis not present

## 2023-02-24 DIAGNOSIS — E785 Hyperlipidemia, unspecified: Secondary | ICD-10-CM

## 2023-02-24 DIAGNOSIS — E039 Hypothyroidism, unspecified: Secondary | ICD-10-CM | POA: Diagnosis not present

## 2023-02-24 DIAGNOSIS — Z0000 Encounter for general adult medical examination without abnormal findings: Secondary | ICD-10-CM | POA: Diagnosis not present

## 2023-02-24 DIAGNOSIS — R739 Hyperglycemia, unspecified: Secondary | ICD-10-CM | POA: Diagnosis not present

## 2023-02-24 DIAGNOSIS — Z23 Encounter for immunization: Secondary | ICD-10-CM

## 2023-02-24 LAB — CBC WITH DIFFERENTIAL/PLATELET
Basophils Absolute: 0 10*3/uL (ref 0.0–0.1)
Basophils Relative: 0.7 % (ref 0.0–3.0)
Eosinophils Absolute: 0.2 10*3/uL (ref 0.0–0.7)
Eosinophils Relative: 4.6 % (ref 0.0–5.0)
HCT: 42.3 % (ref 39.0–52.0)
Hemoglobin: 13.8 g/dL (ref 13.0–17.0)
Lymphocytes Relative: 45.9 % (ref 12.0–46.0)
Lymphs Abs: 2.2 10*3/uL (ref 0.7–4.0)
MCHC: 32.6 g/dL (ref 30.0–36.0)
MCV: 82.1 fl (ref 78.0–100.0)
Monocytes Absolute: 0.4 10*3/uL (ref 0.1–1.0)
Monocytes Relative: 8.6 % (ref 3.0–12.0)
Neutro Abs: 2 10*3/uL (ref 1.4–7.7)
Neutrophils Relative %: 40.2 % — ABNORMAL LOW (ref 43.0–77.0)
Platelets: 223 10*3/uL (ref 150.0–400.0)
RBC: 5.15 Mil/uL (ref 4.22–5.81)
RDW: 14.4 % (ref 11.5–15.5)
WBC: 4.9 10*3/uL (ref 4.0–10.5)

## 2023-02-24 LAB — COMPREHENSIVE METABOLIC PANEL WITH GFR
ALT: 27 U/L (ref 0–53)
AST: 26 U/L (ref 0–37)
Albumin: 4.3 g/dL (ref 3.5–5.2)
Alkaline Phosphatase: 70 U/L (ref 39–117)
BUN: 15 mg/dL (ref 6–23)
CO2: 29 meq/L (ref 19–32)
Calcium: 8.7 mg/dL (ref 8.4–10.5)
Chloride: 105 meq/L (ref 96–112)
Creatinine, Ser: 1.35 mg/dL (ref 0.40–1.50)
GFR: 55.74 mL/min — ABNORMAL LOW
Glucose, Bld: 90 mg/dL (ref 70–99)
Potassium: 3.9 meq/L (ref 3.5–5.1)
Sodium: 140 meq/L (ref 135–145)
Total Bilirubin: 0.6 mg/dL (ref 0.2–1.2)
Total Protein: 7 g/dL (ref 6.0–8.3)

## 2023-02-24 LAB — LIPID PANEL
Cholesterol: 156 mg/dL (ref 0–200)
HDL: 39.7 mg/dL
LDL Cholesterol: 93 mg/dL (ref 0–99)
NonHDL: 116.21
Total CHOL/HDL Ratio: 4
Triglycerides: 116 mg/dL (ref 0.0–149.0)
VLDL: 23.2 mg/dL (ref 0.0–40.0)

## 2023-02-24 LAB — PSA: PSA: 1.29 ng/mL (ref 0.10–4.00)

## 2023-02-24 LAB — TSH: TSH: 19.95 u[IU]/mL — ABNORMAL HIGH (ref 0.35–5.50)

## 2023-02-24 LAB — HEMOGLOBIN A1C: Hgb A1c MFr Bld: 6.6 % — ABNORMAL HIGH (ref 4.6–6.5)

## 2023-02-24 LAB — T4, FREE: Free T4: 0.98 ng/dL (ref 0.60–1.60)

## 2023-02-24 NOTE — Progress Notes (Signed)
 Subjective:    Patient ID: Carlos Corlis Raddle., male    DOB: 04/17/1960, 63 y.o.   MRN: [id number]  HPI  Pt in for wellness exam.   Working at [employer]. Reports moderate healthy diet. Exercise walking his dog twice daily.  Denies smoking. Occasional alcohol use.    On review looks like had one shingle vaccine and will get second today.  Discussed can get covid vaccine at pharmacy,  Pt will go flu vaccine later thru pharmacy.  Pt has high cholesterol- was on atorvastatin  in past.  Diabetes- due for A1c. Pt was metformin  in past but not recenty.  Pt has hypothyroid and is on med.      Review of Systems  Constitutional:  Negative for chills, fatigue and fever.  HENT:  Negative for congestion.   Respiratory:  Negative for cough, chest tightness, shortness of breath and wheezing.   Cardiovascular:  Negative for chest pain and palpitations.  Gastrointestinal:  Negative for abdominal pain, blood in stool, constipation and diarrhea.  Genitourinary:  Negative for dysuria, flank pain and testicular pain.  Musculoskeletal:  Negative for back pain, joint swelling and neck stiffness.  Skin:  Negative for rash.  Neurological:  Negative for dizziness, seizures and headaches.  Hematological:  Negative for adenopathy. Does not bruise/bleed easily.  Psychiatric/Behavioral:  Negative for behavioral problems and dysphoric mood. The patient is not nervous/anxious.     Past Medical History:  Diagnosis Date   Thyroid  disease    hypothyroidism     Social History   Socioeconomic History   Marital status: Single    Spouse name: Not on file   Number of children: Not on file   Years of education: Not on file   Highest education level: Not on file  Occupational History   Not on file  Tobacco Use   Smoking status: Never   Smokeless tobacco: Never  Vaping Use   Vaping status: Never Used  Substance and Sexual Activity   Alcohol use: Yes    Alcohol/week: 3.0 standard drinks of alcohol     Types: 3 Cans of beer per week   Drug use: No   Sexual activity: Yes  Other Topics Concern   Not on file  Social History Narrative   Not on file   Social Drivers of Health   Financial Resource Strain: Not on file  Food Insecurity: Not on file  Transportation Needs: Not on file  Physical Activity: Not on file  Stress: Not on file  Social Connections: Not on file  Intimate Partner Violence: Not on file    Past Surgical History:  Procedure Laterality Date   ARTHROSCOPIC REPAIR ACL Left 2000   COLONOSCOPY     FINGER AMPUTATION  2015   3rd finger left hand   MENISCUS REPAIR Right 1990   NAILBED REPAIR Left 04/16/2015   Procedure: nailbed excision left middle finger;  Surgeon: Ozell Flake, MD;  Location: [facility] ORS;  Service: Orthopedics;  Laterality: Left;   THUMB ARTHROSCOPY Left 2014   WRIST FUSION Right 2000    Family History  Problem Relation Age of Onset   Hypertension Father    Dementia Father    Colon cancer Neg Hx    Colon polyps Neg Hx    Esophageal cancer Neg Hx    Rectal cancer Neg Hx    Stomach cancer Neg Hx     No Known Allergies  Current Outpatient Medications on File Prior to Visit  Medication Sig Dispense Refill  atorvastatin  (LIPITOR) 10 MG tablet Take 1 tablet (10 mg total) by mouth daily. 30 tablet 3   levothyroxine  (SYNTHROID ) 112 MCG tablet Take 1 tablet (112 mcg total) by mouth daily. 90 tablet 3   metFORMIN  (GLUCOPHAGE ) 500 MG tablet Take 1 tablet (500 mg total) by mouth daily with breakfast. 90 tablet 3   sildenafil  (VIAGRA ) 100 MG tablet TAKE 1 TABLET BY MOUTH 1 HOUR PRIOR TO SEX 10 tablet 4   No current facility-administered medications on file prior to visit.    BP 110/72   Pulse 71   Temp 98 F (36.7 C) (Oral)   Resp 16   Ht 6' 5 (1.956 m)   Wt 272 lb 3.2 oz (123.5 kg)   SpO2 98%   BMI 32.28 kg/m       Objective:   Physical Exam        Assessment & Plan:   For you wellness exam today I have ordered cbc, cmp, psa and  lipid panel.  Vaccine given today 2nd shingrix . Discussed covid vaccine and flu.(Decided to do those thru pharmacy on other day)  Recommend exercise and healthy diet.  We will let you know lab results as they come in.  Follow up date appointment will be determined after lab review.    Hypothyroid Pending TSH and T4 results. - Await lab results. -continue levothyroxine . May adjust dose if needed after lab review.  Elevated sugar average in diabetic rang last a1c check -get a1c. advise low sugar diet.  -Then decide on potential rx of metformin  again.  Hyperlipidemia -on lab review may restart/rx atorvastatin .  Li Fragoso, PA-C

## 2023-02-24 NOTE — Patient Instructions (Addendum)
 For you wellness exam today I have ordered cbc, cmp, psa and lipid panel.  Vaccine given today 2nd shingrix . Discussed covid vaccine and flu.(Decided to do those thru pharmacy on other day)  Recommend exercise and healthy diet.  We will let you know lab results as they come in.  Follow up date appointment will be determined after lab review.    Hypothyroid Pending TSH and T4 results. - Await lab results. -continue levothyroxine . May adjust dose if needed after lab review.  Elevated sugar average in diabetic rang last a1c check -get a1c. advise low sugar diet.  -Then decide on potential rx of metformin  again.  Hyperlipidemia -on lab review may restart/rx atorvastatin .  Preventive Care 46-41 Years Old, Male Preventive care refers to lifestyle choices and visits with your health care provider that can promote health and wellness. Preventive care visits are also called wellness exams. What can I expect for my preventive care visit? Counseling During your preventive care visit, your health care provider may ask about your: Medical history, including: Past medical problems. Family medical history. Current health, including: Emotional well-being. Home life and relationship well-being. Sexual activity. Lifestyle, including: Alcohol, nicotine or tobacco, and drug use. Access to firearms. Diet, exercise, and sleep habits. Safety issues such as seatbelt and bike helmet use. Sunscreen use. Work and work astronomer. Physical exam Your health care provider will check your: Height and weight. These may be used to calculate your BMI (body mass index). BMI is a measurement that tells if you are at a healthy weight. Waist circumference. This measures the distance around your waistline. This measurement also tells if you are at a healthy weight and may help predict your risk of certain diseases, such as type 2 diabetes and high blood pressure. Heart rate and blood pressure. Body  temperature. Skin for abnormal spots. What immunizations do I need?  Vaccines are usually given at various ages, according to a schedule. Your health care provider will recommend vaccines for you based on your age, medical history, and lifestyle or other factors, such as travel or where you work. What tests do I need? Screening Your health care provider may recommend screening tests for certain conditions. This may include: Lipid and cholesterol levels. Diabetes screening. This is done by checking your blood sugar (glucose) after you have not eaten for a while (fasting). Hepatitis B test. Hepatitis C test. HIV (human immunodeficiency virus) test. STI (sexually transmitted infection) testing, if you are at risk. Lung cancer screening. Prostate cancer screening. Colorectal cancer screening. Talk with your health care provider about your test results, treatment options, and if necessary, the need for more tests. Follow these instructions at home: Eating and drinking  Eat a diet that includes fresh fruits and vegetables, whole grains, lean protein, and low-fat dairy products. Take vitamin and mineral supplements as recommended by your health care provider. Do not drink alcohol if your health care provider tells you not to drink. If you drink alcohol: Limit how much you have to 0-2 drinks a day. Know how much alcohol is in your drink. In the [place] one drink equals one 12 oz bottle of beer (355 mL), one 5 oz glass of wine (148 mL), or one 1 oz glass of hard liquor (44 mL). Lifestyle Brush your teeth every morning and night with fluoride toothpaste. Floss one time each day. Exercise for at least 30 minutes 5 or more days each week. Do not use any products that contain nicotine or tobacco. These products include cigarettes,  chewing tobacco, and vaping devices, such as e-cigarettes. If you need help quitting, ask your health care provider. Do not use drugs. If you are sexually active,  practice safe sex. Use a condom or other form of protection to prevent STIs. Take aspirin only as told by your health care provider. Make sure that you understand how much to take and what form to take. Work with your health care provider to find out whether it is safe and beneficial for you to take aspirin daily. Find healthy ways to manage stress, such as: Meditation, yoga, or listening to music. Journaling. Talking to a trusted person. Spending time with friends and family. Minimize exposure to UV radiation to reduce your risk of skin cancer. Safety Always wear your seat belt while driving or riding in a vehicle. Do not drive: If you have been drinking alcohol. Do not ride with someone who has been drinking. When you are tired or distracted. While texting. If you have been using any mind-altering substances or drugs. Wear a helmet and other protective equipment during sports activities. If you have firearms in your house, make sure you follow all gun safety procedures. What's next? Go to your health care provider once a year for an annual wellness visit. Ask your health care provider how often you should have your eyes and teeth checked. Stay up to date on all vaccines. This information is not intended to replace advice given to you by your health care provider. Make sure you discuss any questions you have with your health care provider. Document Revised: 07/03/2020 Document Reviewed: 07/03/2020 Elsevier Patient Education  2024 [employer].

## 2023-02-25 ENCOUNTER — Encounter: Payer: Self-pay | Admitting: Medical

## 2023-02-25 NOTE — Addendum Note (Signed)
 Addended by: Serafina Damme on: 02/25/2023 07:36 PM   Modules accepted: Orders

## 2023-04-23 ENCOUNTER — Other Ambulatory Visit (HOSPITAL_BASED_OUTPATIENT_CLINIC_OR_DEPARTMENT_OTHER): Payer: Self-pay

## 2023-04-23 MED ORDER — AMOXICILLIN 500 MG PO CAPS
500.0000 mg | ORAL_CAPSULE | Freq: Three times a day (TID) | ORAL | 0 refills | Status: AC
  Filled 2023-04-23: qty 30, 10d supply, fill #0

## 2023-04-23 MED ORDER — IBUPROFEN 800 MG PO TABS
800.0000 mg | ORAL_TABLET | Freq: Three times a day (TID) | ORAL | 0 refills | Status: AC | PRN
  Filled 2023-04-23: qty 30, 10d supply, fill #0

## 2023-06-23 ENCOUNTER — Other Ambulatory Visit: Payer: Self-pay | Admitting: Medical

## 2023-06-23 ENCOUNTER — Other Ambulatory Visit (HOSPITAL_BASED_OUTPATIENT_CLINIC_OR_DEPARTMENT_OTHER): Payer: Self-pay

## 2023-06-23 MED ORDER — LEVOTHYROXINE SODIUM 112 MCG PO TABS
112.0000 ug | ORAL_TABLET | Freq: Every day | ORAL | 2 refills | Status: AC
  Filled 2023-06-23: qty 90, 90d supply, fill #0
  Filled 2023-09-26: qty 90, 90d supply, fill #1
  Filled 2023-12-30: qty 90, 90d supply, fill #2

## 2024-02-14 ENCOUNTER — Other Ambulatory Visit (HOSPITAL_BASED_OUTPATIENT_CLINIC_OR_DEPARTMENT_OTHER): Payer: Self-pay

## 2024-02-25 ENCOUNTER — Encounter: Payer: BLUE CROSS/BLUE SHIELD | Admitting: Medical

## 2024-02-25 ENCOUNTER — Encounter: Payer: Self-pay | Admitting: Medical

## 2024-02-25 VITALS — BP 130/78 | HR 55 | Temp 98.0°F | Resp 14 | Ht 77.0 in | Wt 257.4 lb

## 2024-02-25 DIAGNOSIS — E785 Hyperlipidemia, unspecified: Secondary | ICD-10-CM

## 2024-02-25 DIAGNOSIS — Z23 Encounter for immunization: Secondary | ICD-10-CM

## 2024-02-25 DIAGNOSIS — Z125 Encounter for screening for malignant neoplasm of prostate: Secondary | ICD-10-CM

## 2024-02-25 DIAGNOSIS — Z0000 Encounter for general adult medical examination without abnormal findings: Secondary | ICD-10-CM

## 2024-02-25 DIAGNOSIS — E118 Type 2 diabetes mellitus with unspecified complications: Secondary | ICD-10-CM

## 2024-02-25 DIAGNOSIS — Z1159 Encounter for screening for other viral diseases: Secondary | ICD-10-CM

## 2024-02-25 DIAGNOSIS — E039 Hypothyroidism, unspecified: Secondary | ICD-10-CM

## 2024-02-25 LAB — COMPREHENSIVE METABOLIC PANEL WITH GFR
ALT: 35 U/L (ref 3–53)
AST: 28 U/L (ref 5–37)
Albumin: 4.5 g/dL (ref 3.5–5.2)
Alkaline Phosphatase: 65 U/L (ref 39–117)
BUN: 13 mg/dL (ref 6–23)
CO2: 32 meq/L (ref 19–32)
Calcium: 9.4 mg/dL (ref 8.4–10.5)
Chloride: 103 meq/L (ref 96–112)
Creatinine, Ser: 1.43 mg/dL (ref 0.40–1.50)
GFR: 51.65 mL/min — ABNORMAL LOW
Glucose, Bld: 84 mg/dL (ref 70–99)
Potassium: 4.2 meq/L (ref 3.5–5.1)
Sodium: 140 meq/L (ref 135–145)
Total Bilirubin: 0.8 mg/dL (ref 0.2–1.2)
Total Protein: 7.2 g/dL (ref 6.0–8.3)

## 2024-02-25 LAB — LIPID PANEL
Cholesterol: 143 mg/dL (ref 28–200)
HDL: 44.3 mg/dL
LDL Cholesterol: 82 mg/dL (ref 10–99)
NonHDL: 99.19
Total CHOL/HDL Ratio: 3
Triglycerides: 88 mg/dL (ref 10.0–149.0)
VLDL: 17.6 mg/dL (ref 0.0–40.0)

## 2024-02-25 LAB — CBC WITH DIFFERENTIAL/PLATELET
Basophils Absolute: 0.1 10*3/uL (ref 0.0–0.1)
Basophils Relative: 1.1 % (ref 0.0–3.0)
Eosinophils Absolute: 0.2 10*3/uL (ref 0.0–0.7)
Eosinophils Relative: 3.9 % (ref 0.0–5.0)
HCT: 42.3 % (ref 39.0–52.0)
Hemoglobin: 14 g/dL (ref 13.0–17.0)
Lymphocytes Relative: 44.3 % (ref 12.0–46.0)
Lymphs Abs: 2.2 10*3/uL (ref 0.7–4.0)
MCHC: 33.1 g/dL (ref 30.0–36.0)
MCV: 80.7 fl (ref 78.0–100.0)
Monocytes Absolute: 0.5 10*3/uL (ref 0.1–1.0)
Monocytes Relative: 9.2 % (ref 3.0–12.0)
Neutro Abs: 2 10*3/uL (ref 1.4–7.7)
Neutrophils Relative %: 41.5 % — ABNORMAL LOW (ref 43.0–77.0)
Platelets: 234 10*3/uL (ref 150.0–400.0)
RBC: 5.24 Mil/uL (ref 4.22–5.81)
RDW: 15.5 % (ref 11.5–15.5)
WBC: 4.9 10*3/uL (ref 4.0–10.5)

## 2024-02-25 LAB — PSA: PSA: 1.63 ng/mL (ref 0.10–4.00)

## 2024-02-25 LAB — MICROALBUMIN / CREATININE URINE RATIO
Creatinine,U: 177 mg/dL
Microalb Creat Ratio: 8.1 mg/g (ref 0.0–30.0)
Microalb, Ur: 1.4 mg/dL (ref 0.7–1.9)

## 2024-02-25 LAB — T4, FREE: Free T4: 0.83 ng/dL (ref 0.60–1.60)

## 2024-02-25 LAB — TSH: TSH: 13.63 u[IU]/mL — ABNORMAL HIGH (ref 0.35–5.50)

## 2024-02-25 LAB — HEMOGLOBIN A1C: Hgb A1c MFr Bld: 6.3 % (ref 4.6–6.5)

## 2024-02-25 NOTE — Patient Instructions (Addendum)
 For you wellness exam today I have ordered cbc, cmp, hep c antibody, psa and lipid panel.  Vaccine given today Td and pcv 20  Recommend exercise and healthy diet.  We will let you know lab results as they come in.  Follow up date appointment will be determined after lab review.    Hypothyroid Pending TSH and T4 results. - Await lab results. -continue levothyroxine . May adjust dose if needed after lab review.   Diabetes type 2. Suspect controlled -get a1c. advise low sugar diet.  -May need to restart if sugar average high. -urine microalbumin   Hyperlipidemia -on lab review may restart/rx atorvastatin .   Preventive Care 51-42 Years Old, Male Preventive care refers to lifestyle choices and visits with your health care provider that can promote health and wellness. Preventive care visits are also called wellness exams. What can I expect for my preventive care visit? Counseling During your preventive care visit, your health care provider may ask about your: Medical history, including: Past medical problems. Family medical history. Current health, including: Emotional well-being. Home life and relationship well-being. Sexual activity. Lifestyle, including: Alcohol, nicotine or tobacco, and drug use. Access to firearms. Diet, exercise, and sleep habits. Safety issues such as seatbelt and bike helmet use. Sunscreen use. Work and work astronomer. Physical exam Your health care provider will check your: Height and weight. These may be used to calculate your BMI (body mass index). BMI is a measurement that tells if you are at a healthy weight. Waist circumference. This measures the distance around your waistline. This measurement also tells if you are at a healthy weight and may help predict your risk of certain diseases, such as type 2 diabetes and high blood pressure. Heart rate and blood pressure. Body temperature. Skin for abnormal spots. What immunizations do I  need?  Vaccines are usually given at various ages, according to a schedule. Your health care provider will recommend vaccines for you based on your age, medical history, and lifestyle or other factors, such as travel or where you work. What tests do I need? Screening Your health care provider may recommend screening tests for certain conditions. This may include: Lipid and cholesterol levels. Diabetes screening. This is done by checking your blood sugar (glucose) after you have not eaten for a while (fasting). Hepatitis B test. Hepatitis C test. HIV (human immunodeficiency virus) test. STI (sexually transmitted infection) testing, if you are at risk. Lung cancer screening. Prostate cancer screening. Colorectal cancer screening. Talk with your health care provider about your test results, treatment options, and if necessary, the need for more tests. Follow these instructions at home: Eating and drinking  Eat a diet that includes fresh fruits and vegetables, whole grains, lean protein, and low-fat dairy products. Take vitamin and mineral supplements as recommended by your health care provider. Do not drink alcohol if your health care provider tells you not to drink. If you drink alcohol: Limit how much you have to 0-2 drinks a day. Know how much alcohol is in your drink. In the [place] one drink equals one 12 oz bottle of beer (355 mL), one 5 oz glass of wine (148 mL), or one 1 oz glass of hard liquor (44 mL). Lifestyle Brush your teeth every morning and night with fluoride toothpaste. Floss one time each day. Exercise for at least 30 minutes 5 or more days each week. Do not use any products that contain nicotine or tobacco. These products include cigarettes, chewing tobacco, and vaping devices, such as  e-cigarettes. If you need help quitting, ask your health care provider. Do not use drugs. If you are sexually active, practice safe sex. Use a condom or other form of protection to prevent  STIs. Take aspirin only as told by your health care provider. Make sure that you understand how much to take and what form to take. Work with your health care provider to find out whether it is safe and beneficial for you to take aspirin daily. Find healthy ways to manage stress, such as: Meditation, yoga, or listening to music. Journaling. Talking to a trusted person. Spending time with friends and family. Minimize exposure to UV radiation to reduce your risk of skin cancer. Safety Always wear your seat belt while driving or riding in a vehicle. Do not drive: If you have been drinking alcohol. Do not ride with someone who has been drinking. When you are tired or distracted. While texting. If you have been using any mind-altering substances or drugs. Wear a helmet and other protective equipment during sports activities. If you have firearms in your house, make sure you follow all gun safety procedures. What's next? Go to your health care provider once a year for an annual wellness visit. Ask your health care provider how often you should have your eyes and teeth checked. Stay up to date on all vaccines. This information is not intended to replace advice given to you by your health care provider. Make sure you discuss any questions you have with your health care provider. Document Revised: 07/03/2020 Document Reviewed: 07/03/2020 Elsevier Patient Education  2024 [employer].

## 2024-02-25 NOTE — Progress Notes (Signed)
 "  Subjective:    Patient ID: Carlos Corlis Raddle., male    DOB: 03-27-1960, 64 y.o.   MRN: [id number]  HPI  Pt in for wellness exam.   Working at [employer]. Reports moderate healthy diet. Exercise walking his dog twice daily.  Denies smoking. Occasional glass of wine.   On review looks like had one shingle vaccine and will get second today.   Discussed can get covid vaccine at pharmacy,   Pt did not get flu vaccine yet. He declines today. We had discussion will get TD today and pcv 20 vaccine   Pt is up to date on colonoscopy.  Pt has high cholesterol- was on atorvastatin  in past.   Diabetes- A1c last year of 6.6. Pt was metformin  in past but not recenty.   Pt has hypothyroid and is on med.  Pt purposefully eating less and watching calories over past year. He want to get down to 250. Has had purposeful weight loss.   Review of Systems  Constitutional:  Negative for chills, fatigue and fever.  HENT:  Negative for congestion.   Respiratory:  Negative for cough, chest tightness, shortness of breath and wheezing.   Cardiovascular:  Negative for chest pain and palpitations.  Gastrointestinal:  Negative for abdominal pain, blood in stool, constipation and diarrhea.  Genitourinary:  Negative for dysuria, flank pain and testicular pain.  Musculoskeletal:  Negative for back pain, joint swelling and neck stiffness.  Skin:  Negative for rash.  Neurological:  Negative for dizziness, seizures and headaches.  Hematological:  Negative for adenopathy. Does not bruise/bleed easily.  Psychiatric/Behavioral:  Negative for behavioral problems and dysphoric mood. The patient is not nervous/anxious.     Past Medical History:  Diagnosis Date   Thyroid  disease    hypothyroidism     Social History   Socioeconomic History   Marital status: Single    Spouse name: Not on file   Number of children: Not on file   Years of education: Not on file   Highest education level: Not on file  Occupational  History   Not on file  Tobacco Use   Smoking status: Never   Smokeless tobacco: Never  Vaping Use   Vaping status: Never Used  Substance and Sexual Activity   Alcohol use: Yes    Alcohol/week: 3.0 standard drinks of alcohol    Types: 3 Cans of beer per week   Drug use: No   Sexual activity: Yes  Other Topics Concern   Not on file  Social History Narrative   Not on file   Social Drivers of Health   Tobacco Use: Low Risk (02/25/2024)   Patient History    Smoking Tobacco Use: Never    Smokeless Tobacco Use: Never    Passive Exposure: Not on file  Financial Resource Strain: Not on file  Food Insecurity: Not on file  Transportation Needs: Not on file  Physical Activity: Not on file  Stress: Not on file  Social Connections: Not on file  Intimate Partner Violence: Not on file  Depression (PHQ2-9): Low Risk (02/25/2024)   Depression (PHQ2-9)    PHQ-2 Score: 0  Alcohol Screen: Not on file  Housing: Not on file  Utilities: Not on file  Health Literacy: Not on file    Past Surgical History:  Procedure Laterality Date   ARTHROSCOPIC REPAIR ACL Left 2000   COLONOSCOPY     FINGER AMPUTATION  2015   3rd finger left hand   MENISCUS REPAIR Right 1990  NAILBED REPAIR Left 04/16/2015   Procedure: nailbed excision left middle finger;  Surgeon: Ozell Flake, MD;  Location: [facility] OR[facility];  Service: Orthopedics;  Laterality: Left;   THUMB ARTHROSCOPY Left 2014   WRIST FUSION Right 2000    Family History  Problem Relation Age of Onset   Hypertension Father    Dementia Father    Colon cancer Neg Hx    Colon polyps Neg Hx    Esophageal cancer Neg Hx    Rectal cancer Neg Hx    Stomach cancer Neg Hx     Allergies[1]  Medications Ordered Prior to Encounter[2]  BP 130/78   Pulse (!) 55   Temp 98 F (36.7 C) (Oral)   Resp 14   Ht 6' 5 (1.956 m)   Wt 257 lb 6.4 oz (116.8 kg)   SpO2 97%   BMI 30.52 kg/m        Objective:   Physical Exam   General Mental Status- Alert.  General Appearance- Not in acute distress.   Skin General: Color- Normal Color. Moisture- Normal Moisture.  Neck No carotid bruits. No JVD.  Chest and Lung Exam Auscultation: Breath Sounds:-CTA  Cardiovascular Auscultation:Rythm- RRR Murmurs & Other Heart Sounds:Auscultation of the heart reveals- No Murmurs.  Abdomen Inspection:-Inspeection Normal. Palpation/Percussion:Note:No mass. Palpation and Percussion of the abdomen reveal- Non Tender, Non Distended + BS, no rebound or guarding.   Neurologic Cranial Nerve exam:- CN III-XII intact(No nystagmus), symmetric smile. Strength:- 5/5 equal and symmetric strength both upper and lower extremities.      Assessment & Plan:   For you wellness exam today I have ordered cbc, cmp, hep c antibody, psa and lipid panel.  Vaccine given today Td and pcv 20  Recommend exercise and healthy diet.  We will let you know lab results as they come in.  Follow up date appointment will be determined after lab review.    Hypothyroid Pending TSH and T4 results. - Await lab results. -continue levothyroxine . May adjust dose if needed after lab review.   Diabetes type 2. Suspect controlled -get a1c. advise low sugar diet.  -May need to restart if sugar average high. -urine microalbumin   Hyperlipidemia -on lab review may restart/rx atorvastatin .  Dallas Maxwell, PA-C     [1] No Known Allergies [2]  Current Outpatient Medications on File Prior to Visit  Medication Sig Dispense Refill   amoxicillin  (AMOXIL ) 500 MG capsule Take 1 capsule (500 mg total) by mouth 3 (three) times daily for 10 days. 30 capsule 0   atorvastatin  (LIPITOR) 10 MG tablet Take 1 tablet (10 mg total) by mouth daily. 30 tablet 3   ibuprofen  (ADVIL ) 800 MG tablet Take 1 tablet (800 mg total) by mouth every 8 (eight) hours as needed for pain. 30 tablet 0   levothyroxine  (SYNTHROID ) 112 MCG tablet Take 1 tablet (112 mcg total) by mouth daily before breakfast. 90 tablet  2   metFORMIN  (GLUCOPHAGE ) 500 MG tablet Take 1 tablet (500 mg total) by mouth daily with breakfast. 90 tablet 3   sildenafil  (VIAGRA ) 100 MG tablet TAKE 1 TABLET BY MOUTH 1 HOUR PRIOR TO SEX 10 tablet 4   No current facility-administered medications on file prior to visit.   "

## 2025-02-26 ENCOUNTER — Encounter: Admitting: Medical
